# Patient Record
Sex: Female | Born: 1935 | Race: White | Hispanic: No | Marital: Married | Smoking: Never smoker
Health system: Southern US, Community
[De-identification: ages and names within clinical notes are randomized; demographics above are authoritative.]

## PROBLEM LIST (undated history)

## (undated) DIAGNOSIS — Z86018 Personal history of other benign neoplasm: Secondary | ICD-10-CM

## (undated) DIAGNOSIS — C439 Malignant melanoma of skin, unspecified: Secondary | ICD-10-CM

## (undated) DIAGNOSIS — I10 Essential (primary) hypertension: Secondary | ICD-10-CM

## (undated) DIAGNOSIS — E039 Hypothyroidism, unspecified: Secondary | ICD-10-CM

## (undated) DIAGNOSIS — E079 Disorder of thyroid, unspecified: Secondary | ICD-10-CM

## (undated) DIAGNOSIS — M1990 Unspecified osteoarthritis, unspecified site: Secondary | ICD-10-CM

## (undated) DIAGNOSIS — C801 Malignant (primary) neoplasm, unspecified: Secondary | ICD-10-CM

## (undated) DIAGNOSIS — I499 Cardiac arrhythmia, unspecified: Secondary | ICD-10-CM

## (undated) DIAGNOSIS — R490 Dysphonia: Secondary | ICD-10-CM

## (undated) DIAGNOSIS — K589 Irritable bowel syndrome without diarrhea: Secondary | ICD-10-CM

## (undated) DIAGNOSIS — L570 Actinic keratosis: Secondary | ICD-10-CM

## (undated) HISTORY — DX: Malignant melanoma of skin, unspecified: C43.9

## (undated) HISTORY — DX: Irritable bowel syndrome, unspecified: K58.9

## (undated) HISTORY — DX: Disorder of thyroid, unspecified: E07.9

## (undated) HISTORY — PX: BREAST EXCISIONAL BIOPSY: SUR124

## (undated) HISTORY — PX: CHOLECYSTECTOMY: SHX55

## (undated) HISTORY — PX: HEMORRHOID SURGERY: SHX153

## (undated) HISTORY — DX: Unspecified osteoarthritis, unspecified site: M19.90

## (undated) HISTORY — DX: Essential (primary) hypertension: I10

## (undated) HISTORY — PX: ABDOMINAL HYSTERECTOMY: SHX81

## (undated) HISTORY — DX: Actinic keratosis: L57.0

---
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
## 2004-11-28 ENCOUNTER — Ambulatory Visit: Payer: Self-pay | Admitting: Family Medicine

## 2005-11-04 ENCOUNTER — Ambulatory Visit: Payer: Self-pay | Admitting: Family Medicine

## 2005-12-05 ENCOUNTER — Ambulatory Visit: Payer: Self-pay | Admitting: Family Medicine

## 2005-12-17 ENCOUNTER — Ambulatory Visit: Payer: Self-pay | Admitting: Gastroenterology

## 2006-12-09 ENCOUNTER — Ambulatory Visit: Payer: Self-pay | Admitting: Family Medicine

## 2007-12-10 ENCOUNTER — Ambulatory Visit: Payer: Self-pay | Admitting: Family Medicine

## 2008-12-12 ENCOUNTER — Ambulatory Visit: Payer: Self-pay | Admitting: Family Medicine

## 2009-10-26 ENCOUNTER — Ambulatory Visit (HOSPITAL_COMMUNITY): Admission: RE | Admit: 2009-10-26 | Discharge: 2009-10-26 | Payer: Self-pay | Admitting: Ophthalmology

## 2009-11-23 ENCOUNTER — Ambulatory Visit (HOSPITAL_COMMUNITY): Admission: RE | Admit: 2009-11-23 | Discharge: 2009-11-23 | Payer: Self-pay | Admitting: Ophthalmology

## 2009-12-14 ENCOUNTER — Ambulatory Visit: Payer: Self-pay | Admitting: Family Medicine

## 2010-06-09 LAB — BASIC METABOLIC PANEL
CO2: 30 mEq/L (ref 19–32)
Calcium: 9.4 mg/dL (ref 8.4–10.5)
Chloride: 103 mEq/L (ref 96–112)
GFR calc Af Amer: >60 mL/min (ref >60)
Sodium: 138 mEq/L (ref 135–145)

## 2010-06-09 LAB — HEMOGLOBIN AND HEMATOCRIT, BLOOD
HCT: 43 % (ref 36.0–46.0)
Hemoglobin: 14.4 g/dL (ref 12.0–15.0)

## 2010-12-31 ENCOUNTER — Ambulatory Visit: Payer: Self-pay | Admitting: Family Medicine

## 2011-01-07 ENCOUNTER — Ambulatory Visit: Payer: Self-pay | Admitting: Urology

## 2011-01-30 ENCOUNTER — Ambulatory Visit: Payer: Self-pay | Admitting: Urology

## 2011-02-04 ENCOUNTER — Ambulatory Visit: Payer: Self-pay | Admitting: Urology

## 2011-02-06 LAB — PATHOLOGY REPORT

## 2012-01-01 ENCOUNTER — Ambulatory Visit: Payer: Self-pay | Admitting: Family Medicine

## 2013-01-01 ENCOUNTER — Ambulatory Visit: Payer: Self-pay | Admitting: Family Medicine

## 2014-01-03 ENCOUNTER — Ambulatory Visit: Payer: Self-pay | Admitting: Family Medicine

## 2014-10-04 ENCOUNTER — Other Ambulatory Visit: Payer: Self-pay | Admitting: Family Medicine

## 2014-10-04 DIAGNOSIS — Z1231 Encounter for screening mammogram for malignant neoplasm of breast: Secondary | ICD-10-CM

## 2015-01-05 ENCOUNTER — Other Ambulatory Visit: Payer: Self-pay | Admitting: Family Medicine

## 2015-01-05 ENCOUNTER — Ambulatory Visit
Admission: RE | Admit: 2015-01-05 | Discharge: 2015-01-05 | Disposition: A | Discharge status: Home / Self Care | Payer: Medicare PPO | Source: Ambulatory Visit | Attending: Family Medicine | Admitting: Family Medicine

## 2015-01-05 DIAGNOSIS — Z1231 Encounter for screening mammogram for malignant neoplasm of breast: Secondary | ICD-10-CM | POA: Diagnosis present

## 2015-10-12 ENCOUNTER — Other Ambulatory Visit: Payer: Self-pay | Admitting: Family Medicine

## 2015-10-12 DIAGNOSIS — Z1239 Encounter for other screening for malignant neoplasm of breast: Secondary | ICD-10-CM

## 2016-01-08 ENCOUNTER — Ambulatory Visit
Admission: RE | Admit: 2016-01-08 | Discharge: 2016-01-08 | Disposition: A | Discharge status: Home / Self Care | Payer: Medicare Other | Source: Ambulatory Visit | Attending: Family Medicine | Admitting: Family Medicine

## 2016-01-08 ENCOUNTER — Other Ambulatory Visit: Payer: Self-pay | Admitting: Family Medicine

## 2016-01-08 DIAGNOSIS — Z1231 Encounter for screening mammogram for malignant neoplasm of breast: Secondary | ICD-10-CM | POA: Diagnosis not present

## 2016-01-08 DIAGNOSIS — Z1239 Encounter for other screening for malignant neoplasm of breast: Secondary | ICD-10-CM

## 2016-10-18 ENCOUNTER — Other Ambulatory Visit: Payer: Self-pay | Admitting: Family Medicine

## 2016-10-18 DIAGNOSIS — Z1231 Encounter for screening mammogram for malignant neoplasm of breast: Secondary | ICD-10-CM

## 2017-01-09 ENCOUNTER — Ambulatory Visit
Admission: RE | Admit: 2017-01-09 | Discharge: 2017-01-09 | Disposition: A | Discharge status: Home / Self Care | Payer: Medicare Other | Source: Ambulatory Visit | Attending: Family Medicine | Admitting: Family Medicine

## 2017-01-09 DIAGNOSIS — Z1231 Encounter for screening mammogram for malignant neoplasm of breast: Secondary | ICD-10-CM | POA: Diagnosis not present

## 2017-01-09 HISTORY — DX: Malignant (primary) neoplasm, unspecified: C80.1

## 2017-07-28 ENCOUNTER — Other Ambulatory Visit: Payer: Self-pay

## 2017-07-28 ENCOUNTER — Emergency Department
Admission: EM | Admit: 2017-07-28 | Discharge: 2017-07-28 | Disposition: A | Discharge status: Home / Self Care | Payer: Medicare Other | Attending: Emergency Medicine | Admitting: Emergency Medicine

## 2017-07-28 ENCOUNTER — Emergency Department: Payer: Medicare Other

## 2017-07-28 ENCOUNTER — Encounter: Payer: Self-pay | Admitting: Emergency Medicine

## 2017-07-28 DIAGNOSIS — R42 Dizziness and giddiness: Secondary | ICD-10-CM | POA: Insufficient documentation

## 2017-07-28 DIAGNOSIS — Z79899 Other long term (current) drug therapy: Secondary | ICD-10-CM | POA: Insufficient documentation

## 2017-07-28 DIAGNOSIS — M542 Cervicalgia: Secondary | ICD-10-CM | POA: Insufficient documentation

## 2017-07-28 DIAGNOSIS — Z7982 Long term (current) use of aspirin: Secondary | ICD-10-CM | POA: Diagnosis not present

## 2017-07-28 LAB — TROPONIN I: Troponin I: <0.03 ng/mL (ref <0.03)

## 2017-07-28 LAB — CBC
HEMATOCRIT: 43.5 % (ref 35.0–47.0)
HEMOGLOBIN: 14.9 g/dL (ref 12.0–16.0)
MCH: 31.6 pg (ref 26.0–34.0)
MCHC: 34.2 g/dL (ref 32.0–36.0)
MCV: 92.2 fL (ref 80.0–100.0)
Platelets: 222 10*3/uL (ref 150–440)
RBC: 4.71 MIL/uL (ref 3.80–5.20)
RDW: 12.6 % (ref 11.5–14.5)
WBC: 6.4 10*3/uL (ref 3.6–11.0)

## 2017-07-28 LAB — BASIC METABOLIC PANEL
ANION GAP: 6 (ref 5–15)
BUN: 17 mg/dL (ref 6–20)
CHLORIDE: 105 mmol/L (ref 101–111)
CO2: 29 mmol/L (ref 22–32)
Calcium: 9.3 mg/dL (ref 8.9–10.3)
Creatinine, Ser: 0.84 mg/dL (ref 0.44–1.00)
GFR calc Af Amer: >60 mL/min (ref >60)
GFR calc non Af Amer: 59 mL/min — ABNORMAL LOW (ref >60)
GLUCOSE: 164 mg/dL — AB (ref 65–99)
POTASSIUM: 3.8 mmol/L (ref 3.5–5.1)
Sodium: 140 mmol/L (ref 135–145)

## 2017-07-28 LAB — URINALYSIS, COMPLETE (UACMP) WITH MICROSCOPIC
BACTERIA UA: NONE SEEN
Bilirubin Urine: NEGATIVE
Glucose, UA: NEGATIVE mg/dL
Hgb urine dipstick: NEGATIVE
KETONES UR: NEGATIVE mg/dL
Leukocytes, UA: NEGATIVE
Nitrite: NEGATIVE
PROTEIN: NEGATIVE mg/dL
Specific Gravity, Urine: 1.004 — ABNORMAL LOW (ref 1.005–1.030)
Squamous Epithelial / LPF: NONE SEEN (ref 0–5)
pH: 6 (ref 5.0–8.0)

## 2017-07-28 MED ORDER — SODIUM CHLORIDE 0.9 % IV BOLUS
1000.0000 mL | Freq: Once | INTRAVENOUS | Status: AC
  Administered 2017-07-28: 1000 mL via INTRAVENOUS

## 2017-07-28 MED ORDER — IOPAMIDOL (ISOVUE-370) INJECTION 76%
75.0000 mL | Freq: Once | INTRAVENOUS | Status: AC | PRN
Start: 2017-07-28 — End: 2017-07-28
  Administered 2017-07-28: 75 mL via INTRAVENOUS

## 2017-07-28 NOTE — ED Triage Notes (Signed)
Pt presents to ED with c/o dizziness. Pt states had surgery to R side "years ago" and can't turn her head to the R. Pt states HA to the R side of her head. Pt states a knot popped up to the R side of her neck and since that patient has been more light headed.

## 2017-07-28 NOTE — ED Notes (Signed)
Pt taken to CT.

## 2017-07-28 NOTE — ED Provider Notes (Signed)
[facility] Emergency Department Provider Note  ____________________________________________   First MD Initiated Contact with Patient 07/28/17 1620     (approximate)  I have reviewed the triage vital signs and the nursing notes.   HISTORY  Chief Complaint Dizziness   HPI Amanda Mann is a 84 y.o. female who is presenting to the emergency department with right-sided neck pain that is worsening over the past several weeks.  She says that she had a mass resected from the right side of her neck in the 70s.  She is saying that ever since this mass was resected she has been unable to turn her head to the right because of pain and also passing out.  However, over the past several weeks she has felt a recurrence of a mass to the right side of the base of her neck as well as feeling lightheaded when she turns her head to the right.   Past Medical History:  Diagnosis Date  . Cancer ([place])    melanoma    There are no active problems to display for this patient.   Past Surgical History:  Procedure Laterality Date  . ABDOMINAL HYSTERECTOMY    . BREAST BIOPSY Bilateral    neg  . CHOLECYSTECTOMY    . HEMORRHOID SURGERY      Prior to Admission medications   Medication Sig Start Date End Date Taking? Authorizing Provider  aspirin EC 81 MG tablet Take 1 tablet by mouth daily.   Yes [provider]  atenolol (TENORMIN) 25 MG tablet Take 1 tablet by mouth daily. 06/03/17  Yes [provider]  Cholecalciferol (VITAMIN D3) 2000 units capsule Take 1 capsule by mouth daily.   Yes [provider]  levothyroxine (SYNTHROID, LEVOTHROID) 75 MCG tablet Take 1 tablet by mouth daily. 06/02/17  Yes [provider]  Multiple Vitamin (MULTI-VITAMINS) TABS Take 1 tablet by mouth daily.   Yes [provider]    Allergies Amoxicillin; Ibandronic acid; Moxifloxacin; and Sulfa antibiotics  Family History  Problem Relation Age of Onset   . Breast cancer Sister 40    Social History Social History   Tobacco Use  . Smoking status: Never Smoker  . Smokeless tobacco: Never Used  Substance Use Topics  . Alcohol use: Not Currently  . Drug use: Never    Review of Systems  Constitutional: No fever/chills Eyes: No visual changes. ENT: No sore throat. Cardiovascular: Denies chest pain. Respiratory: Denies shortness of breath. Gastrointestinal: No abdominal pain.  No nausea, no vomiting.  No diarrhea.  No constipation. Genitourinary: Negative for dysuria. Musculoskeletal: Negative for back pain. Skin: Negative for rash. Neurological: Negative for headaches, focal weakness or numbness.   ____________________________________________   PHYSICAL EXAM:  VITAL SIGNS: ED Triage Vitals [07/28/17 1520]  Enc Vitals Group     BP (!) 211/52     Pulse Rate 78     Resp 18     Temp 97.7 F (36.5 C)     Temp Source Oral     SpO2 100 %     Weight 127 lb (57.6 kg)     Height 5\' 1"  (1.549 m)     Head Circumference      Peak Flow      Pain Score      Pain Loc      Pain Edu?      Excl. in [place]?     Constitutional: Alert and oriented. Well appearing and in no acute distress. Eyes:  Conjunctivae are normal.  Head: Atraumatic. Nose: No congestion/rhinnorhea. Mouth/Throat: Mucous membranes are moist.  Neck: No stridor.  Possible scar tissue between the clavicle and the trapezius muscle.  However I do palpate any discernible large mass.  There is no erythema, induration, tenderness. Cardiovascular: Normal rate, regular rhythm. Grossly normal heart sounds.   Respiratory: Normal respiratory effort.  No retractions. Lungs CTAB. Gastrointestinal: Soft and nontender. No distention.  Musculoskeletal: No lower extremity tenderness nor edema.  No joint effusions. Neurologic:  Normal speech and language. No gross focal neurologic deficits are appreciated. Skin:  Skin is warm, dry and intact. No rash noted. Psychiatric: Mood and  affect are normal. Speech and behavior are normal.  ____________________________________________   LABS (all labs ordered are listed, but only abnormal results are displayed)  Labs Reviewed  BASIC METABOLIC PANEL - Abnormal; Notable for the following components:      Result Value   Glucose, Bld 164 (*)    GFR calc non Af Amer 59 (*)    All other components within normal limits  URINALYSIS, COMPLETE (UACMP) WITH MICROSCOPIC - Abnormal; Notable for the following components:   Color, Urine STRAW (*)    APPearance CLEAR (*)    Specific Gravity, Urine 1.004 (*)    All other components within normal limits  CBC  TROPONIN I  CBG MONITORING, ED   ____________________________________________  EKG  ED ECG REPORT I, Doran Stabler, the attending physician, personally viewed and interpreted this ECG.   Date: 07/28/2017  EKG Time: 1554  Rate: 71  Rhythm: normal sinus rhythm  Axis: Normal  Intervals:none  ST&T Change: No ST segment elevation or depression.  No abnormal T wave inversion.  ____________________________________________  RADIOLOGY  CT of the brain without any acute process.  CT angiography without acute vascular abnormality.  No acute soft tissue abnormalities.  7 mm hyperdense nodule noted within the right lobe of the thyroid.  Doubtful significance. ____________________________________________   PROCEDURES  Procedure(s) performed:   Procedures  Critical Care performed:   ____________________________________________   INITIAL IMPRESSION / ASSESSMENT AND PLAN / ED COURSE  Pertinent labs & imaging results that were available during my care of the patient were reviewed by me and considered in my medical decision making (see chart for details).  DDX: Lightheadedness, dehydration, likely abnormality, CVA, ACS, UTI, circulatory compromise, mass recurrence As part of my medical decision making, I reviewed the following data within the electronic medical  record:  Notes from prior primary care visits.  ----------------------------------------- 6:26 PM on 07/28/2017 -----------------------------------------  Patient at this time without complaint.  Denies lightheadedness after fluids.  Reassuring CT angiography without any vascular compromise nor is there any recurrence of any visible mass.  Patient will follow-up with her primary care doctor.  She is understanding of the plan and willing to comply. ____________________________________________   FINAL CLINICAL IMPRESSION(S) / ED DIAGNOSES  Lightheadedness.    NEW MEDICATIONS STARTED DURING THIS VISIT:  New Prescriptions   No medications on file     Note:  This document was prepared using Dragon voice recognition software and may include unintentional dictation errors.     Orbie Pyo, MD 07/28/17 [phone]

## 2017-07-28 NOTE — ED Notes (Signed)
Pt ambulatory to toilet with steady gait noted.  

## 2017-07-28 NOTE — ED Notes (Signed)
Dr. Schaevitz at bedside.  

## 2017-12-03 ENCOUNTER — Other Ambulatory Visit: Payer: Self-pay | Admitting: Family Medicine

## 2017-12-03 DIAGNOSIS — Z1231 Encounter for screening mammogram for malignant neoplasm of breast: Secondary | ICD-10-CM

## 2018-01-12 ENCOUNTER — Ambulatory Visit
Admission: RE | Admit: 2018-01-12 | Discharge: 2018-01-12 | Disposition: A | Discharge status: Home / Self Care | Payer: Medicare Other | Source: Ambulatory Visit | Attending: Family Medicine | Admitting: Family Medicine

## 2018-01-12 DIAGNOSIS — Z1231 Encounter for screening mammogram for malignant neoplasm of breast: Secondary | ICD-10-CM | POA: Diagnosis present

## 2018-01-21 DIAGNOSIS — Z86018 Personal history of other benign neoplasm: Secondary | ICD-10-CM

## 2018-01-21 HISTORY — DX: Personal history of other benign neoplasm: Z86.018

## 2018-01-22 ENCOUNTER — Other Ambulatory Visit: Payer: Self-pay | Admitting: Family Medicine

## 2018-01-22 DIAGNOSIS — N6320 Unspecified lump in the left breast, unspecified quadrant: Secondary | ICD-10-CM

## 2018-01-22 DIAGNOSIS — R928 Other abnormal and inconclusive findings on diagnostic imaging of breast: Secondary | ICD-10-CM

## 2018-01-29 ENCOUNTER — Ambulatory Visit
Admission: RE | Admit: 2018-01-29 | Discharge: 2018-01-29 | Disposition: A | Discharge status: Home / Self Care | Payer: Medicare Other | Source: Ambulatory Visit | Attending: Family Medicine | Admitting: Family Medicine

## 2018-01-29 DIAGNOSIS — R928 Other abnormal and inconclusive findings on diagnostic imaging of breast: Secondary | ICD-10-CM | POA: Insufficient documentation

## 2018-01-29 DIAGNOSIS — N6320 Unspecified lump in the left breast, unspecified quadrant: Secondary | ICD-10-CM | POA: Diagnosis present

## 2018-02-02 ENCOUNTER — Other Ambulatory Visit: Payer: Self-pay | Admitting: Family Medicine

## 2018-02-02 DIAGNOSIS — R928 Other abnormal and inconclusive findings on diagnostic imaging of breast: Secondary | ICD-10-CM

## 2018-02-05 ENCOUNTER — Ambulatory Visit
Admission: RE | Admit: 2018-02-05 | Discharge: 2018-02-05 | Disposition: A | Discharge status: Home / Self Care | Payer: Medicare Other | Source: Ambulatory Visit | Attending: Family Medicine | Admitting: Family Medicine

## 2018-02-05 DIAGNOSIS — R928 Other abnormal and inconclusive findings on diagnostic imaging of breast: Secondary | ICD-10-CM | POA: Diagnosis present

## 2018-02-05 HISTORY — PX: BREAST BIOPSY: SHX20

## 2018-02-12 ENCOUNTER — Other Ambulatory Visit: Payer: Self-pay

## 2018-02-12 DIAGNOSIS — C50919 Malignant neoplasm of unspecified site of unspecified female breast: Secondary | ICD-10-CM

## 2018-02-12 NOTE — Progress Notes (Signed)
  Oncology Nurse Navigator Documentation  Navigator Location: [facility]Med Onc (02/12/18 1600) Referral date to RadOnc/MedOnc: 02/17/18 (02/12/18 1600) )Navigator Encounter Type: Introductory phone call (02/12/18 1600)   Abnormal Finding Date: 01/29/18 (02/12/18 1600) Confirmed Diagnosis Date: 02/05/18 (02/12/18 1600)               Patient Visit Type: Initial (02/12/18 1600) Treatment Phase: Pre-Tx/Tx Discussion (02/12/18 1600) Barriers/Navigation Needs: Coordination of Care;Education (02/12/18 1600) Education: Accessing Care/ Finding Providers;Coping with Diagnosis/ Prognosis (02/12/18 1600) Interventions: Coordination of Care;Referrals (02/12/18 1600)   Coordination of Care: Appts (02/12/18 1600)                  Time Spent with Patient: 60 (02/12/18 1600)   Introduced to IT trainer.  Will take  Breast Cancer Treatment Handbook/folder with hospital services  To Dr. Deniece Ree office for patient at her initial surgical consult 02/13/18 at 12:00.  She is scheduled to see Dr. Tasia Catchings on 02/17/18 at 10:45.  Plan to accompany patient to this initial Med/Onc appointment.  Patient reports she discussed pathology results with her primary physician, and she would like to hear recommendations for treatment.

## 2018-02-13 ENCOUNTER — Other Ambulatory Visit: Payer: Self-pay | Admitting: Specialist

## 2018-02-13 ENCOUNTER — Ambulatory Visit: Payer: Self-pay | Admitting: General Surgery

## 2018-02-13 LAB — SURGICAL PATHOLOGY

## 2018-02-13 NOTE — H&P (Signed)
PATIENT PROFILE: Amanda Mann is a 84 y.o. female who presents to the Clinic for consultation at the request of Dr. Lovie Mann for evaluation of left breast cancer.  PCP:  Amanda Nancy, MD  HISTORY OF PRESENT ILLNESS: Amanda Mann reports having her usual screening mammogram. Patient denies any breast pain, palpable mass, skin changes or nipple changes. She had screening mammogram on 01/12/18. It was found with a suspicious left breast mass that lead to diagnostic mammogram and biopsy. Biopsy showed invasive mammary carcinoma of 4 mm on the left breast at 10:30. Mass is ER/PR positive. Clip marker left in place.   Patient has personal history of melanoma.  Family history of breast cancer: sister  Family history of other cancers: none             Menarche: 81 years Menopause: 84 year old (hysterectomy due to bleeding) Used OCP: denies Used estrogen and progesterone therapy: estrogen therapy for 4-7 years  History of Radiation to the chest: none Previous history of bilateral breast biopsy.   PROBLEM LIST:         Problem List  Date Reviewed: 10/20/2017         Noted   Malignant neoplasm of left female breast (CMS-HCC) 02/11/2018   Tachycardia 04/21/2017   Essential hypertension 03/14/2014   Acquired hypothyroidism, unspecified 03/14/2014   Osteoporosis Unknown      GENERAL REVIEW OF SYSTEMS:   General ROS: negative for - chills, fatigue, fever, weight gain or weight loss Allergy and Immunology ROS: negative for - hives  Hematological and Lymphatic ROS: negative for - bleeding problems or bruising, negative for palpable nodes Endocrine ROS: negative for - heat or cold intolerance, hair changes Respiratory ROS: negative for - cough, shortness of breath or wheezing Cardiovascular ROS: no chest pain or palpitations GI ROS: negative for nausea, vomiting, abdominal pain, diarrhea, constipation Musculoskeletal ROS: negative for - joint swelling or muscle  pain Neurological ROS: negative for - confusion, syncope Dermatological ROS: negative for pruritus and rash Psychiatric: negative for anxiety, depression, difficulty sleeping and memory loss  MEDICATIONS: CurrentMedications        Current Outpatient Medications  Medication Sig Dispense Refill  . atenolol (TENORMIN) 25 MG tablet ONE-HALF TABLET BY MOUTH TWICE DAILY 90 tablet 3  . cholecalciferol (VITAMIN D3) 2,000 unit capsule Take 2,000 Units by mouth once daily.    Marland Kitchen levothyroxine (SYNTHROID, LEVOTHROID) 75 MCG tablet TAKE 1 TABLET EVERY DAY ON EMPTY STOMACHWITH A GLASS OF WATER AT LEAST 30-60 MINBEFORE BREAKFAST 90 tablet 1  . multivitamin tablet Take 1 tablet by mouth once daily.    Marland Kitchen aspirin 81 MG EC tablet Take 81 mg by mouth once daily.     No current facility-administered medications for this visit.       ALLERGIES: Amoxicillin; Boniva [ibandronate]; Ibandronic acid; Moxifloxacin; and Sulfa (sulfonamide antibiotics)  PAST MEDICAL HISTORY:     Past Medical History:  Diagnosis Date  . Allergic rhinitis   . Breast cancer (CMS-HCC) 02/11/2018  . Cataract   . Essential hypertension, benign   . Menopausal syndrome   . Osteoporosis   . PUD (peptic ulcer disease)   . Tachycardia   . Unspecified hypothyroidism   . Unspecified vitamin D deficiency     PAST SURGICAL HISTORY: History reviewed. No pertinent surgical history.   FAMILY HISTORY: History reviewed. No pertinent family history.   SOCIAL HISTORY: Social History          Socioeconomic History  . Marital status:  Married    Spouse name: Not on file  . Number of children: Not on file  . Years of education: Not on file  . Highest education level: Not on file  Occupational History  . Not on file  Social Needs  . Financial resource strain: Not on file  . Food insecurity:    Worry: Not on file    Inability: Not on file  . Transportation needs:    Medical: Not on file     Non-medical: Not on file  Tobacco Use  . Smoking status: Never Smoker  . Smokeless tobacco: Never Used  Substance and Sexual Activity  . Alcohol use: No  . Drug use: No  . Sexual activity: Never  Other Topics Concern  . Not on file  Social History Narrative   Exercise:  4-5 times a week for 20 minutes.    Diet:  Red meat none, fast foods and fried foods once a week.   Colon:11/2012   Mammo: 2014   Pap: 2009      PHYSICAL EXAM: Vitals:   02/13/18 1157  BP: 152/74  Pulse: 78  Temp: 36.1 C (96.9 F)   Body mass index is 24.76 kg/m. Weight: 57.5 kg (126 lb 12.8 oz)   GENERAL: Alert, active, oriented x3  HEENT: Pupils equal reactive to light. Extraocular movements are intact. Sclera clear. Palpebral conjunctiva normal red color.Pharynx clear.  NECK: Supple with no palpable mass and no adenopathy.  LUNGS: Sound clear with no rales rhonchi or wheezes.  HEART: Regular rhythm S1 and S2 without murmur.  BREAST: Right breast normal without mass, skin or nipple changes or axillary nodes. Left breast with large ecchymosis, and hematoma.   ABDOMEN: Soft and depressible, nontender with no palpable mass, no hepatomegaly.  EXTREMITIES: Well-developed well-nourished symmetrical with no dependent edema.  NEUROLOGICAL: Awake alert oriented, facial expression symmetrical, moving all extremities.  REVIEW OF DATA: I have reviewed the following data today:      Initial consult on 02/13/2018  Component Date Value  . WBC (White Blood Cell Co* 02/13/2018 7.3   . RBC (Red Blood Cell Coun* 02/13/2018 4.45   . Hemoglobin 02/13/2018 13.9   . Hematocrit 02/13/2018 41.8   . MCV (Mean Corpuscular Vo* 02/13/2018 93.9   . MCH (Mean Corpuscular He* 02/13/2018 31.2   . MCHC (Mean Corpuscular H* 02/13/2018 33.3   . Platelet Count 02/13/2018 233   . RDW-CV (Red Cell Distrib* 02/13/2018 11.9   . MPV (Mean Platelet Volum* 02/13/2018 10.6   . Neutrophils 02/13/2018 4.38   .  Lymphocytes 02/13/2018 1.72   . Monocytes 02/13/2018 0.78   . Eosinophils 02/13/2018 0.30   . Basophils 02/13/2018 0.07   . Neutrophil % 02/13/2018 60.4   . Lymphocyte % 02/13/2018 23.7   . Monocyte % 02/13/2018 10.7   . Eosinophil % 02/13/2018 4.1   . Basophil% 02/13/2018 1.0   . Immature Granulocyte % 02/13/2018 0.1   . Immature Granulocyte Cou* 02/13/2018 0.01      ASSESSMENT: Ms. Curb is a 84 y.o. female presenting for consultation for left breast cancer.    Patient was oriented again about the pathology results. Surgical alternatives were discussed with patient including partial vs total mastectomy. Surgical technique and post operative care was discussed with patient. Risk of surgery was discussed with patient including but not limited to: wound infection, seroma, hematoma, brachial plexopathy, mondor's disease (thrombosis of small veins of breast), chronic wound pain, breast lymphedema, altered sensation to the nipple and  cosmesis among others.   After a long discussion with patient and her husband, patient refers wants to proceed with surgical management. I oriented the patient about the risk of anesthesia and her advance age. She understand. I also recommended the patient to discuss with Oncologist about the other alternative of treatment in adjunct with surgical management.   PLAN: 1. Left needle guided partial mastectomy 2. Left axillary sentinel lymph node biopsy 3. CBC, CMP 4. Internal Medicine clearance (Dr. Lovie Mann) 5. Avoid aspirin 5 days before surgery 6. See Oncologist on Tuesday 26th of November.  7. Contact us if has any question or concern.   Patient and her husband verbalized understanding, all questions were answered, and were agreeable with the plan outlined above.   Herbert Pun, MD  Electronically signed by Herbert Pun, MD

## 2018-02-13 NOTE — H&P (View-Only) (Signed)
PATIENT PROFILE: Amanda Mann is a 84 y.o. female who presents to the Clinic for consultation at the request of Dr. Lovie Mann for evaluation of left breast cancer.  PCP:  Amanda Nancy, MD  HISTORY OF PRESENT ILLNESS: Amanda Mann reports having her usual screening mammogram. Patient denies any breast pain, palpable mass, skin changes or nipple changes. She had screening mammogram on 01/12/18. It was found with a suspicious left breast mass that lead to diagnostic mammogram and biopsy. Biopsy showed invasive mammary carcinoma of 4 mm on the left breast at 10:30. Mass is ER/PR positive. Clip marker left in place.   Patient has personal history of melanoma.  Family history of breast cancer: sister  Family history of other cancers: none             Menarche: 55 years Menopause: 84 year old (hysterectomy due to bleeding) Used OCP: denies Used estrogen and progesterone therapy: estrogen therapy for 4-7 years  History of Radiation to the chest: none Previous history of bilateral breast biopsy.   PROBLEM LIST:         Problem List  Date Reviewed: 10/20/2017         Noted   Malignant neoplasm of left female breast (CMS-HCC) 02/11/2018   Tachycardia 04/21/2017   Essential hypertension 03/14/2014   Acquired hypothyroidism, unspecified 03/14/2014   Osteoporosis Unknown      GENERAL REVIEW OF SYSTEMS:   General ROS: negative for - chills, fatigue, fever, weight gain or weight loss Allergy and Immunology ROS: negative for - hives  Hematological and Lymphatic ROS: negative for - bleeding problems or bruising, negative for palpable nodes Endocrine ROS: negative for - heat or cold intolerance, hair changes Respiratory ROS: negative for - cough, shortness of breath or wheezing Cardiovascular ROS: no chest pain or palpitations GI ROS: negative for nausea, vomiting, abdominal pain, diarrhea, constipation Musculoskeletal ROS: negative for - joint swelling or muscle  pain Neurological ROS: negative for - confusion, syncope Dermatological ROS: negative for pruritus and rash Psychiatric: negative for anxiety, depression, difficulty sleeping and memory loss  MEDICATIONS: CurrentMedications        Current Outpatient Medications  Medication Sig Dispense Refill  . atenolol (TENORMIN) 25 MG tablet ONE-HALF TABLET BY MOUTH TWICE DAILY 90 tablet 3  . cholecalciferol (VITAMIN D3) 2,000 unit capsule Take 2,000 Units by mouth once daily.    Marland Kitchen levothyroxine (SYNTHROID, LEVOTHROID) 75 MCG tablet TAKE 1 TABLET EVERY DAY ON EMPTY STOMACHWITH A GLASS OF WATER AT LEAST 30-60 MINBEFORE BREAKFAST 90 tablet 1  . multivitamin tablet Take 1 tablet by mouth once daily.    Marland Kitchen aspirin 81 MG EC tablet Take 81 mg by mouth once daily.     No current facility-administered medications for this visit.       ALLERGIES: Amoxicillin; Boniva [ibandronate]; Ibandronic acid; Moxifloxacin; and Sulfa (sulfonamide antibiotics)  PAST MEDICAL HISTORY:     Past Medical History:  Diagnosis Date  . Allergic rhinitis   . Breast cancer (CMS-HCC) 02/11/2018  . Cataract   . Essential hypertension, benign   . Menopausal syndrome   . Osteoporosis   . PUD (peptic ulcer disease)   . Tachycardia   . Unspecified hypothyroidism   . Unspecified vitamin D deficiency     PAST SURGICAL HISTORY: History reviewed. No pertinent surgical history.   FAMILY HISTORY: History reviewed. No pertinent family history.   SOCIAL HISTORY: Social History          Socioeconomic History  . Marital status:  Married    Spouse name: Not on file  . Number of children: Not on file  . Years of education: Not on file  . Highest education level: Not on file  Occupational History  . Not on file  Social Needs  . Financial resource strain: Not on file  . Food insecurity:    Worry: Not on file    Inability: Not on file  . Transportation needs:    Medical: Not on file     Non-medical: Not on file  Tobacco Use  . Smoking status: Never Smoker  . Smokeless tobacco: Never Used  Substance and Sexual Activity  . Alcohol use: No  . Drug use: No  . Sexual activity: Never  Other Topics Concern  . Not on file  Social History Narrative   Exercise:  4-5 times a week for 20 minutes.    Diet:  Red meat none, fast foods and fried foods once a week.   Colon:11/2012   Mammo: 2014   Pap: 2009      PHYSICAL EXAM: Vitals:   02/13/18 1157  BP: 152/74  Pulse: 78  Temp: 36.1 C (96.9 F)   Body mass index is 24.76 kg/m. Weight: 57.5 kg (126 lb 12.8 oz)   GENERAL: Alert, active, oriented x3  HEENT: Pupils equal reactive to light. Extraocular movements are intact. Sclera clear. Palpebral conjunctiva normal red color.Pharynx clear.  NECK: Supple with no palpable mass and no adenopathy.  LUNGS: Sound clear with no rales rhonchi or wheezes.  HEART: Regular rhythm S1 and S2 without murmur.  BREAST: Right breast normal without mass, skin or nipple changes or axillary nodes. Left breast with large ecchymosis, and hematoma.   ABDOMEN: Soft and depressible, nontender with no palpable mass, no hepatomegaly.  EXTREMITIES: Well-developed well-nourished symmetrical with no dependent edema.  NEUROLOGICAL: Awake alert oriented, facial expression symmetrical, moving all extremities.  REVIEW OF DATA: I have reviewed the following data today:      Initial consult on 02/13/2018  Component Date Value  . WBC (White Blood Cell Co* 02/13/2018 7.3   . RBC (Red Blood Cell Coun* 02/13/2018 4.45   . Hemoglobin 02/13/2018 13.9   . Hematocrit 02/13/2018 41.8   . MCV (Mean Corpuscular Vo* 02/13/2018 93.9   . MCH (Mean Corpuscular He* 02/13/2018 31.2   . MCHC (Mean Corpuscular H* 02/13/2018 33.3   . Platelet Count 02/13/2018 233   . RDW-CV (Red Cell Distrib* 02/13/2018 11.9   . MPV (Mean Platelet Volum* 02/13/2018 10.6   . Neutrophils 02/13/2018 4.38   .  Lymphocytes 02/13/2018 1.72   . Monocytes 02/13/2018 0.78   . Eosinophils 02/13/2018 0.30   . Basophils 02/13/2018 0.07   . Neutrophil % 02/13/2018 60.4   . Lymphocyte % 02/13/2018 23.7   . Monocyte % 02/13/2018 10.7   . Eosinophil % 02/13/2018 4.1   . Basophil% 02/13/2018 1.0   . Immature Granulocyte % 02/13/2018 0.1   . Immature Granulocyte Cou* 02/13/2018 0.01      ASSESSMENT: Ms. Beedle is a 84 y.o. female presenting for consultation for left breast cancer.    Patient was oriented again about the pathology results. Surgical alternatives were discussed with patient including partial vs total mastectomy. Surgical technique and post operative care was discussed with patient. Risk of surgery was discussed with patient including but not limited to: wound infection, seroma, hematoma, brachial plexopathy, mondor's disease (thrombosis of small veins of breast), chronic wound pain, breast lymphedema, altered sensation to the nipple and  cosmesis among others.   After a long discussion with patient and her husband, patient refers wants to proceed with surgical management. I oriented the patient about the risk of anesthesia and her advance age. She understand. I also recommended the patient to discuss with Oncologist about the other alternative of treatment in adjunct with surgical management.   PLAN: 1. Left needle guided partial mastectomy 2. Left axillary sentinel lymph node biopsy 3. CBC, CMP 4. Internal Medicine clearance (Dr. Lovie Mann) 5. Avoid aspirin 5 days before surgery 6. See Oncologist on Tuesday 26th of November.  7. Contact us if has any question or concern.   Patient and her husband verbalized understanding, all questions were answered, and were agreeable with the plan outlined above.   Herbert Pun, MD  Electronically signed by Herbert Pun, MD

## 2018-02-16 ENCOUNTER — Other Ambulatory Visit: Payer: Self-pay | Admitting: General Surgery

## 2018-02-16 DIAGNOSIS — C50212 Malignant neoplasm of upper-inner quadrant of left female breast: Secondary | ICD-10-CM

## 2018-02-16 DIAGNOSIS — Z170 Estrogen receptor positive status [ER+]: Principal | ICD-10-CM

## 2018-02-17 ENCOUNTER — Inpatient Hospital Stay: Payer: Medicare Other | Attending: Oncology | Admitting: Oncology

## 2018-02-17 ENCOUNTER — Encounter: Payer: Self-pay | Admitting: Oncology

## 2018-02-17 ENCOUNTER — Other Ambulatory Visit: Payer: Self-pay | Admitting: General Surgery

## 2018-02-17 ENCOUNTER — Encounter
Admission: RE | Admit: 2018-02-17 | Discharge: 2018-02-17 | Disposition: A | Discharge status: Home / Self Care | Payer: Medicare Other | Source: Ambulatory Visit | Attending: General Surgery | Admitting: General Surgery

## 2018-02-17 ENCOUNTER — Other Ambulatory Visit: Payer: Self-pay

## 2018-02-17 VITALS — BP 145/68 | HR 72 | Temp 97.7°F | Resp 18 | Ht 61.0 in | Wt 125.7 lb

## 2018-02-17 DIAGNOSIS — Z170 Estrogen receptor positive status [ER+]: Secondary | ICD-10-CM

## 2018-02-17 DIAGNOSIS — C50212 Malignant neoplasm of upper-inner quadrant of left female breast: Secondary | ICD-10-CM

## 2018-02-17 DIAGNOSIS — C50912 Malignant neoplasm of unspecified site of left female breast: Secondary | ICD-10-CM

## 2018-02-17 DIAGNOSIS — C50919 Malignant neoplasm of unspecified site of unspecified female breast: Secondary | ICD-10-CM

## 2018-02-17 HISTORY — DX: Dysphonia: R49.0

## 2018-02-17 HISTORY — DX: Cardiac arrhythmia, unspecified: I49.9

## 2018-02-17 HISTORY — DX: Hypothyroidism, unspecified: E03.9

## 2018-02-17 NOTE — Progress Notes (Signed)
Hematology/Oncology Consult note [facility] Telephone:([phone] [phone]   Patient Care Team: Juluis Pitch, MD as PCP - General (Family Medicine)  REFERRING PROVIDER:  CHIEF COMPLAINTS/REASON FOR VISIT:  Evaluation of breast cancer  HISTORY OF PRESENTING ILLNESS:  Amanda Mann is a  84 y.o.  female with PMH listed below who was referred to me for evaluation of newly discovered left breast cancer.  Patient had routine screening mammogram on 01/12/2018 which showed possible mass left breast warrants evaluation. Patient underwent unilateral left diagnostic mammogram on 01/29/2018 and ultrasound which showed 0.4 x 0.2 x 0.2 cm left breast mass.  02/05/2018 ultrasound guided of the breast mass biopsy  pathology showed: Invasive mammary carcinoma, no special type.  Grade 1, ER 100% positive, PR 100% positive, HER-2 negative. Patient has been seen by Dr. Peyton Najjar for surgical evaluation.  She is interested in lumpectomy  Nipple discharge: Denies Family history: Sister was diagnosed with breast cancer at age of 4. OCP use: denies.  Estrogen and progesterone therapy: Remote estrogen replacement therapy for about 4 to 7 years. History of radiation to chest: denies.  Previous breast surgery: Previous bilateral breast biopsy Previous history of melanoma Patient remains active despite her advanced age.  Independent and able to carry ADLs and IADLs.  Lives with husband.  Review of Systems  Constitutional: Negative for appetite change, chills, fatigue and fever.  HENT:   Negative for hearing loss and voice change.   Eyes: Negative for eye problems.  Respiratory: Negative for chest tightness and cough.   Cardiovascular: Negative for chest pain.  Gastrointestinal: Negative for abdominal distention, abdominal pain and blood in stool.  Endocrine: Negative for hot flashes.  Genitourinary: Negative for difficulty urinating and frequency.   Musculoskeletal:  Negative for arthralgias.  Skin: Negative for itching and rash.  Neurological: Negative for extremity weakness.  Hematological: Negative for adenopathy.  Psychiatric/Behavioral: Negative for confusion.    MEDICAL HISTORY:  Past Medical History:  Diagnosis Date  . Arthritis   . Cancer ([place])    melanoma  . Hypertension   . Irritable bowel syndrome   . Thyroid disease     SURGICAL HISTORY: Past Surgical History:  Procedure Laterality Date  . ABDOMINAL HYSTERECTOMY     total  . BREAST BIOPSY Left 02/05/2018   left breast u/s core path pending  . BREAST EXCISIONAL BIOPSY Bilateral    neg  . CHOLECYSTECTOMY    . HEMORRHOID SURGERY      SOCIAL HISTORY: Social History   Socioeconomic History  . Marital status: Married    Spouse name: Caprice Renshaw   . Number of children: Not on file  . Years of education: Not on file  . Highest education level: Not on file  Occupational History  . Occupation: retired  Scientific laboratory technician  . Financial resource strain: Not on file  . Food insecurity:    Worry: Not on file    Inability: Not on file  . Transportation needs:    Medical: Not on file    Non-medical: Not on file  Tobacco Use  . Smoking status: Never Smoker  . Smokeless tobacco: Never Used  Substance and Sexual Activity  . Alcohol use: Not Currently  . Drug use: Never  . Sexual activity: Not on file  Lifestyle  . Physical activity:    Days per week: Not on file    Minutes per session: Not on file  . Stress: Not on file  Relationships  . Social connections:  Talks on phone: Not on file    Gets together: Not on file    Attends religious service: Not on file    Active member of club or organization: Not on file    Attends meetings of clubs or organizations: Not on file    Relationship status: Not on file  . Intimate partner violence:    Fear of current or ex partner: Not on file    Emotionally abused: Not on file    Physically abused: Not on file    Forced sexual  activity: Not on file  Other Topics Concern  . Not on file  Social History Narrative  . Not on file    FAMILY HISTORY: Family History  Problem Relation Age of Onset  . Breast cancer Sister 32  . Hypertension Mother   . Stroke Mother   . Hypertension Father   . Stroke Father   . Stroke Brother     ALLERGIES:  is allergic to amoxicillin; ibandronic acid; moxifloxacin; and sulfa antibiotics.  MEDICATIONS:  Current Outpatient Medications  Medication Sig Dispense Refill  . atenolol (TENORMIN) 25 MG tablet Take 12.5 mg by mouth 2 (two) times daily.     . Cholecalciferol (VITAMIN D3) 2000 units capsule Take 2,000 Units by mouth daily.     Marland Kitchen levothyroxine (SYNTHROID, LEVOTHROID) 75 MCG tablet Take 75 mcg by mouth daily before breakfast.     . metroNIDAZOLE (METROGEL) 0.75 % gel Apply 1 application topically at bedtime.    . Multiple Vitamin (MULTI-VITAMINS) TABS Take 1 tablet by mouth daily.    Marland Kitchen aspirin EC 81 MG tablet Take 81 mg by mouth daily.      No current facility-administered medications for this visit.      PHYSICAL EXAMINATION: ECOG PERFORMANCE STATUS: 0 - Asymptomatic Vitals:   02/17/18 1057  BP: (!) 145/68  Pulse: 72  Resp: 18  Temp: 97.7 F (36.5 C)   Filed Weights   02/17/18 1057  Weight: 125 lb 11.2 oz (57 kg)    Physical Exam  Constitutional: She is oriented to person, place, and time. No distress.  HENT:  Head: Normocephalic and atraumatic.  Mouth/Throat: Oropharynx is clear and moist.  Eyes: Pupils are equal, round, and reactive to light. EOM are normal. No scleral icterus.  Neck: Normal range of motion. Neck supple.  Cardiovascular: Normal rate, regular rhythm and normal heart sounds.  Pulmonary/Chest: Effort normal. No respiratory distress. She has no wheezes.  Abdominal: Soft. Bowel sounds are normal. She exhibits no distension and no mass. There is no tenderness.  Musculoskeletal: Normal range of motion. She exhibits no edema or deformity.    Neurological: She is alert and oriented to person, place, and time. No cranial nerve deficit. Coordination normal.  Skin: Skin is warm and dry. No rash noted. No erythema.  Psychiatric: She has a normal mood and affect. Her behavior is normal. Thought content normal.  Breast exam was performed in seated and lying down position. Patient is status post breast mass biopsy, bruising at the biopsy site. . I can feel an area of thickening, can be due to post biopsy changes.  . No evidence of left axillary adenopathy. No evidence of any palpable masses or lumps in the right breast. No evidence of right axillary adenopathy     LABORATORY DATA:  I have reviewed the data as listed Lab Results  Component Value Date   WBC 6.4 07/28/2017   HGB 14.9 07/28/2017   HCT 43.5 07/28/2017  MCV 92.2 07/28/2017   PLT 222 07/28/2017   Recent Labs    07/28/17 1556  NA 140  K 3.8  CL 105  CO2 29  GLUCOSE 164*  BUN 17  CREATININE 0.84  CALCIUM 9.3  GFRNONAA 59*  GFRAA >60   Iron/TIBC/Ferritin/ %Sat No results found for: IRON, TIBC, FERRITIN, IRONPCTSAT      ASSESSMENT & PLAN:  1. Invasive carcinoma of breast ([place])    I independently reviewed the patient's mammogram images and agree with findings.  Reviewed patient's pathology reports.  Patient's case was discussed on breast tumor conference on 02/16/2018.  Patient has been seen by Dr. Peyton Najjar and has scheduled to get lumpectomy with sentinel lymph node biopsy. Per radiation oncology Dr. Baruch Gouty, he would not offer adjuvant radiation due to patient's advanced age.  Patient has good performance status and in good general health status..  And strongly ER positive PR positive, T1a disease, grade 1.  Early stage breast cancer may not affect her life expectancy.  Would not offer adjuvant chemotherapy.  We discussed about rationale of adjuvant chronic therapy. Discussed about potential side effects from aromatase inhibitor including bone loss,  pathological fracture, vasomotor symptoms, etc.  We will have another discussion with patient after her surgery with finalized pathology report.   All questions were answered. The patient knows to call the clinic with any problems questions or concerns.  Return of visit: 2 weeks after surgery Thank you for this kind referral and the opportunity to participate in the care of this patient. A copy of today's note is routed to referring provider  Total face to face encounter time for this patient visit was 60 min. >50% of the time was  spent in counseling and coordination of care.    Earlie Server, MD, PhD Hematology Oncology [facility] at [facility] Pager- [phone] 02/17/2018

## 2018-02-17 NOTE — Progress Notes (Signed)
Patient here for initial visit. °

## 2018-02-17 NOTE — Patient Instructions (Signed)
Your procedure is scheduled on:02/23/18  8:20 AM Report to [facility] To find out your arrival time please call [phone] between [place] on 02/20/18 Remember: Instructions that are not followed completely may result in serious medical risk,  up to and including death, or upon the discretion of your surgeon and anesthesiologist your  surgery may need to be rescheduled.     _X__ 1. Do not eat food after midnight the night before your procedure.                 No gum chewing or hard candies. You may drink clear liquids up to 2 hours                 before you are scheduled to arrive for your surgery- DO not drink clear                 liquids within 2 hours of the start of your surgery.                 Clear Liquids include:  water, apple juice without pulp, clear carbohydrate                 drink such as Clearfast of Gatorade, Black Coffee or Tea (Do not add                 anything to coffee or tea).  __X__2.  On the morning of surgery brush your teeth with toothpaste and water, you                may rinse your mouth with mouthwash if you wish.  Do not swallow any toothpaste of mouthwash.     _X__ 3.  No Alcohol for 24 hours before or after surgery.   _X__ 4.  Do Not Smoke or use e-cigarettes For 24 Hours Prior to Your Surgery.                 Do not use any chewable tobacco products for at least 6 hours prior to                 surgery.  ____  5.  Bring all medications with you on the day of surgery if instructed.   __X__  6.  Notify your doctor if there is any change in your medical condition      (cold, fever, infections).     Do not wear jewelry, make-up, hairpins, clips or nail polish. Do not wear lotions, powders, or perfumes. You may wear deodorant. Do not shave 48 hours prior to surgery. Men may shave face and neck. Do not bring valuables to the hospital.    [facility] is not responsible for any belongings or  valuables.  Contacts, dentures or bridgework may not be worn into surgery. Leave your suitcase in the car. After surgery it may be brought to your room. For patients admitted to the hospital, discharge time is determined by your treatment team.   Patients discharged the day of surgery will not be allowed to drive home.   Please read over the following fact sheets that you were given:   Surgical Site Infection Prevention    __X__ Take these medicines the morning of surgery with A SIP OF WATER:    1. ATENOLOL  2. LEVOTHYROXINE  3.   4.  5.  6.  ____ Fleet Enema (as directed)   __X__ Use CHG Soap as directed  ____  Use inhalers on the day of surgery  ____ Stop metformin 2 days prior to surgery    ____ Take 1/2 of usual insulin dose the night before surgery. No insulin the morning          of surgery.   _X___ Stop Coumadin/Plavix/aspirin on    ASPIRIN STOPPED 2 WEEKS AGO ____ Stop Anti-inflammatories on    ____ Stop supplements until after surgery.    ____ Bring C-Pap to the hospital.

## 2018-02-18 ENCOUNTER — Encounter: Payer: Self-pay | Admitting: *Deleted

## 2018-02-22 DIAGNOSIS — C50919 Malignant neoplasm of unspecified site of unspecified female breast: Secondary | ICD-10-CM

## 2018-02-22 HISTORY — DX: Malignant neoplasm of unspecified site of unspecified female breast: C50.919

## 2018-02-22 MED ORDER — CLINDAMYCIN PHOSPHATE 900 MG/50ML IV SOLN
900.0000 mg | INTRAVENOUS | Status: AC
  Administered 2018-02-23: 900 mg via INTRAVENOUS

## 2018-02-23 ENCOUNTER — Ambulatory Visit: Payer: Medicare Other | Admitting: Anesthesiology

## 2018-02-23 ENCOUNTER — Ambulatory Visit
Admission: RE | Admit: 2018-02-23 | Discharge: 2018-02-23 | Disposition: A | Discharge status: Home / Self Care | Payer: Medicare Other | Source: Ambulatory Visit | Attending: General Surgery | Admitting: General Surgery

## 2018-02-23 ENCOUNTER — Other Ambulatory Visit: Payer: Self-pay

## 2018-02-23 ENCOUNTER — Encounter
Admission: RE | Admit: 2018-02-23 | Discharge: 2018-02-23 | Disposition: A | Discharge status: Home / Self Care | Payer: Medicare Other | Source: Ambulatory Visit | Attending: General Surgery | Admitting: General Surgery

## 2018-02-23 ENCOUNTER — Encounter
Admission: RE | Disposition: A | Discharge status: Home / Self Care | Payer: Self-pay | Source: Ambulatory Visit | Attending: General Surgery

## 2018-02-23 DIAGNOSIS — Z882 Allergy status to sulfonamides status: Secondary | ICD-10-CM | POA: Diagnosis not present

## 2018-02-23 DIAGNOSIS — C50212 Malignant neoplasm of upper-inner quadrant of left female breast: Secondary | ICD-10-CM

## 2018-02-23 DIAGNOSIS — E559 Vitamin D deficiency, unspecified: Secondary | ICD-10-CM | POA: Diagnosis not present

## 2018-02-23 DIAGNOSIS — Z8711 Personal history of peptic ulcer disease: Secondary | ICD-10-CM | POA: Diagnosis not present

## 2018-02-23 DIAGNOSIS — Z170 Estrogen receptor positive status [ER+]: Secondary | ICD-10-CM | POA: Insufficient documentation

## 2018-02-23 DIAGNOSIS — I10 Essential (primary) hypertension: Secondary | ICD-10-CM | POA: Diagnosis not present

## 2018-02-23 DIAGNOSIS — C50912 Malignant neoplasm of unspecified site of left female breast: Secondary | ICD-10-CM | POA: Insufficient documentation

## 2018-02-23 DIAGNOSIS — Z880 Allergy status to penicillin: Secondary | ICD-10-CM | POA: Diagnosis not present

## 2018-02-23 DIAGNOSIS — Z7982 Long term (current) use of aspirin: Secondary | ICD-10-CM | POA: Insufficient documentation

## 2018-02-23 DIAGNOSIS — E039 Hypothyroidism, unspecified: Secondary | ICD-10-CM | POA: Insufficient documentation

## 2018-02-23 DIAGNOSIS — Z85820 Personal history of malignant melanoma of skin: Secondary | ICD-10-CM | POA: Diagnosis not present

## 2018-02-23 DIAGNOSIS — Z79899 Other long term (current) drug therapy: Secondary | ICD-10-CM | POA: Diagnosis not present

## 2018-02-23 HISTORY — PX: PARTIAL MASTECTOMY WITH NEEDLE LOCALIZATION: SHX6008

## 2018-02-23 HISTORY — PX: BREAST LUMPECTOMY: SHX2

## 2018-02-23 HISTORY — PX: BREAST EXCISIONAL BIOPSY: SUR124

## 2018-02-23 HISTORY — PX: SENTINEL NODE BIOPSY: SHX6608

## 2018-02-23 SURGERY — PARTIAL MASTECTOMY WITH NEEDLE LOCALIZATION
Anesthesia: General | Laterality: Left

## 2018-02-23 MED ORDER — PROPOFOL 10 MG/ML IV BOLUS
INTRAVENOUS | Status: DC | PRN
  Administered 2018-02-23: 50 mg via INTRAVENOUS
  Administered 2018-02-23: 30 mg via INTRAVENOUS

## 2018-02-23 MED ORDER — TECHNETIUM TC 99M SULFUR COLLOID FILTERED
0.8430 | Freq: Once | INTRAVENOUS | Status: AC | PRN
  Administered 2018-02-23: 0.843 via INTRADERMAL

## 2018-02-23 MED ORDER — CEFAZOLIN SODIUM-DEXTROSE 2-4 GM/100ML-% IV SOLN
INTRAVENOUS | Status: AC
  Filled 2018-02-23: qty 100

## 2018-02-23 MED ORDER — LACTATED RINGERS IV SOLN
INTRAVENOUS | Status: DC
  Administered 2018-02-23: 10:00:00 via INTRAVENOUS

## 2018-02-23 MED ORDER — LIDOCAINE HCL (CARDIAC) PF 100 MG/5ML IV SOSY
PREFILLED_SYRINGE | INTRAVENOUS | Status: DC | PRN
  Administered 2018-02-23: 40 mg via INTRAVENOUS

## 2018-02-23 MED ORDER — FAMOTIDINE 20 MG PO TABS
20.0000 mg | ORAL_TABLET | Freq: Once | ORAL | Status: AC
  Administered 2018-02-23: 20 mg via ORAL

## 2018-02-23 MED ORDER — DEXAMETHASONE SODIUM PHOSPHATE 10 MG/ML IJ SOLN
INTRAMUSCULAR | Status: DC | PRN
  Administered 2018-02-23: 5 mg via INTRAVENOUS

## 2018-02-23 MED ORDER — BUPIVACAINE-EPINEPHRINE (PF) 0.5% -1:200000 IJ SOLN
INTRAMUSCULAR | Status: AC
  Filled 2018-02-23: qty 30

## 2018-02-23 MED ORDER — FENTANYL CITRATE (PF) 100 MCG/2ML IJ SOLN: 25.0000 ug | INTRAMUSCULAR | Status: DC | PRN

## 2018-02-23 MED ORDER — FENTANYL CITRATE (PF) 100 MCG/2ML IJ SOLN
INTRAMUSCULAR | Status: DC | PRN
  Administered 2018-02-23 (×2): 50 ug via INTRAVENOUS

## 2018-02-23 MED ORDER — SEVOFLURANE IN SOLN
RESPIRATORY_TRACT | Status: AC
  Filled 2018-02-23: qty 250

## 2018-02-23 MED ORDER — BUPIVACAINE-EPINEPHRINE 0.5% -1:200000 IJ SOLN
INTRAMUSCULAR | Status: DC | PRN
  Administered 2018-02-23: 8 mL

## 2018-02-23 MED ORDER — FENTANYL CITRATE (PF) 100 MCG/2ML IJ SOLN
INTRAMUSCULAR | Status: AC
  Filled 2018-02-23: qty 2

## 2018-02-23 MED ORDER — FAMOTIDINE 20 MG PO TABS
ORAL_TABLET | ORAL | Status: AC
  Filled 2018-02-23: qty 1

## 2018-02-23 MED ORDER — ACETAMINOPHEN 10 MG/ML IV SOLN
INTRAVENOUS | Status: AC
  Filled 2018-02-23: qty 100

## 2018-02-23 MED ORDER — ONDANSETRON HCL 4 MG/2ML IJ SOLN
INTRAMUSCULAR | Status: DC | PRN
  Administered 2018-02-23: 4 mg via INTRAVENOUS

## 2018-02-23 MED ORDER — HYDROCODONE-ACETAMINOPHEN 5-325 MG PO TABS: 1.0000 | ORAL_TABLET | ORAL | 0 refills | Status: AC | PRN

## 2018-02-23 MED ORDER — MIDAZOLAM HCL 2 MG/2ML IJ SOLN
INTRAMUSCULAR | Status: AC
  Filled 2018-02-23: qty 2

## 2018-02-23 MED ORDER — ONDANSETRON HCL 4 MG/2ML IJ SOLN: 4.0000 mg | Freq: Once | INTRAMUSCULAR | Status: DC | PRN

## 2018-02-23 MED ORDER — CLINDAMYCIN PHOSPHATE 600 MG/50ML IV SOLN
INTRAVENOUS | Status: AC
  Filled 2018-02-23: qty 50

## 2018-02-23 MED ORDER — CLINDAMYCIN PHOSPHATE 900 MG/50ML IV SOLN
INTRAVENOUS | Status: AC
  Filled 2018-02-23: qty 50

## 2018-02-23 MED ORDER — ACETAMINOPHEN 10 MG/ML IV SOLN
INTRAVENOUS | Status: DC | PRN
  Administered 2018-02-23: 1000 mg via INTRAVENOUS

## 2018-02-23 SURGICAL SUPPLY — 39 items
CANISTER SUCT 1200ML W/VALVE (MISCELLANEOUS) ×3 IMPLANT
CHLORAPREP W/TINT 26ML (MISCELLANEOUS) ×3 IMPLANT
CNTNR SPEC 2.5X3XGRAD LEK (MISCELLANEOUS) ×2
CONT SPEC 4OZ STER OR WHT (MISCELLANEOUS) ×4
CONTAINER SPEC 2.5X3XGRAD LEK (MISCELLANEOUS) ×2 IMPLANT
COVER WAND RF STERILE (DRAPES) ×3 IMPLANT
DERMABOND ADVANCED (GAUZE/BANDAGES/DRESSINGS) ×2
DERMABOND ADVANCED .7 DNX12 (GAUZE/BANDAGES/DRESSINGS) ×1 IMPLANT
DEVICE DUBIN SPECIMEN MAMMOGRA (MISCELLANEOUS) ×3 IMPLANT
DRAPE LAPAROTOMY 77X122 PED (DRAPES) ×3 IMPLANT
DRAPE LAPAROTOMY TRNSV 106X77 (MISCELLANEOUS) ×3 IMPLANT
ELECT REM PT RETURN 9FT ADLT (ELECTROSURGICAL) ×3
ELECTRODE REM PT RTRN 9FT ADLT (ELECTROSURGICAL) ×1 IMPLANT
GLOVE BIO SURGEON STRL SZ 6.5 (GLOVE) ×2 IMPLANT
GLOVE BIO SURGEONS STRL SZ 6.5 (GLOVE) ×1
GLOVE BIOGEL M 6.5 STRL (GLOVE) ×3 IMPLANT
GOWN STRL REUS W/ TWL LRG LVL3 (GOWN DISPOSABLE) ×2 IMPLANT
GOWN STRL REUS W/TWL LRG LVL3 (GOWN DISPOSABLE) ×4
KIT TURNOVER KIT A (KITS) ×3 IMPLANT
LABEL OR SOLS (LABEL) ×3 IMPLANT
MARGIN MAP 10MM (MISCELLANEOUS) ×3 IMPLANT
NDL SAFETY ECLIPSE 18X1.5 (NEEDLE) ×1 IMPLANT
NEEDLE HYPO 18GX1.5 SHARP (NEEDLE) ×2
NEEDLE HYPO 22GX1.5 SAFETY (NEEDLE) ×3 IMPLANT
NEEDLE HYPO 25X1 1.5 SAFETY (NEEDLE) ×3 IMPLANT
PACK BASIN MINOR ARMC (MISCELLANEOUS) ×3 IMPLANT
RETRACTOR WOUND ALXS 18CM SML (MISCELLANEOUS) ×1 IMPLANT
RTRCTR WOUND ALEXIS O 18CM SML (MISCELLANEOUS) ×3
SLEVE PROBE SENORX GAMMA FIND (MISCELLANEOUS) ×3 IMPLANT
SUT ETHILON 3-0 FS-10 30 BLK (SUTURE) ×6
SUT MNCRL 4-0 (SUTURE) ×2
SUT MNCRL 4-0 27XMFL (SUTURE) ×1
SUT SILK 2 0 SH (SUTURE) IMPLANT
SUT VIC AB 3-0 SH 27 (SUTURE) ×4
SUT VIC AB 3-0 SH 27X BRD (SUTURE) ×2 IMPLANT
SUTURE EHLN 3-0 FS-10 30 BLK (SUTURE) ×2 IMPLANT
SUTURE MNCRL 4-0 27XMF (SUTURE) ×1 IMPLANT
SYR 10ML LL (SYRINGE) ×3 IMPLANT
WATER STERILE IRR 1000ML POUR (IV SOLUTION) ×3 IMPLANT

## 2018-02-23 NOTE — Anesthesia Preprocedure Evaluation (Signed)
Anesthesia Evaluation  Patient identified by MRN, date of birth, ID band Patient awake    Reviewed: Allergy & Precautions, H&P , NPO status , Patient's Chart, lab work & pertinent test results, reviewed documented beta blocker date and time   History of Anesthesia Complications Negative for: history of anesthetic complications  Airway Mallampati: I  TM Distance: >3 FB Neck ROM: full    Dental  (+) Dental Advidsory Given, Caps, Missing, Teeth Intact   Pulmonary neg pulmonary ROS,           Cardiovascular Exercise Tolerance: Good hypertension, (-) angina(-) CAD, (-) Past MI, (-) Cardiac Stents and (-) CABG + dysrhythmias (-) Valvular Problems/Murmurs     Neuro/Psych negative neurological ROS  negative psych ROS   GI/Hepatic negative GI ROS, Neg liver ROS,   Endo/Other  neg diabetesHypothyroidism   Renal/GU negative Renal ROS  negative genitourinary   Musculoskeletal   Abdominal   Peds  Hematology negative hematology ROS (+)   Anesthesia Other Findings Past Medical History: No date: Arthritis No date: Cancer ([place])     Comment:  melanoma No date: Dysrhythmia     Comment:  chronic tachycardia No date: Hoarseness of voice     Comment:  BIL VC BOWING BY DR MCQUEEN 2017. NOTE ON CHART No date: Hypertension No date: Hypothyroidism No date: Irritable bowel syndrome No date: Thyroid disease   Reproductive/Obstetrics negative OB ROS                             Anesthesia Physical Anesthesia Plan  ASA: III  Anesthesia Plan: General   Post-op Pain Management:    Induction: Intravenous  PONV Risk Score and Plan: 3 and Ondansetron, Dexamethasone and Treatment may vary due to age or medical condition  Airway Management Planned: LMA  Additional Equipment:   Intra-op Plan:   Post-operative Plan: Extubation in OR  Informed Consent: I have reviewed the patients History and Physical,  chart, labs and discussed the procedure including the risks, benefits and alternatives for the proposed anesthesia with the patient or authorized representative who has indicated his/her understanding and acceptance.   Dental Advisory Given  Plan Discussed with: Anesthesiologist, CRNA and Surgeon  Anesthesia Plan Comments:         Anesthesia Quick Evaluation

## 2018-02-23 NOTE — Anesthesia Post-op Follow-up Note (Signed)
Anesthesia QCDR form completed.        

## 2018-02-23 NOTE — Interval H&P Note (Signed)
History and Physical Interval Note:  02/23/2018 1:25 PM  Amanda Mann  has presented today for surgery, with the diagnosis of MALIGNANT NEOPLASM OF LEFT BREAST  The various methods of treatment have been discussed with the patient and family. After consideration of risks, benefits and other options for treatment, the patient has consented to  Procedure(s): PARTIAL MASTECTOMY WITH NEEDLE LOCALIZATION (Left) SENTINEL NODE BIOPSY (Left) as a surgical intervention .  The patient's history has been reviewed, patient examined, no change in status, stable for surgery.  I have reviewed the patient's chart and labs.  Left chest marked in the pre procedure room. Questions were answered to the patient's satisfaction.     Herbert Pun

## 2018-02-23 NOTE — Anesthesia Procedure Notes (Signed)
Procedure Name: LMA Insertion Date/Time: 02/23/2018 3:50 PM Performed by: Nelda Marseille, CRNA Pre-anesthesia Checklist: Patient identified, Patient being monitored, Timeout performed, Emergency Drugs available and Suction available Patient Re-evaluated:Patient Re-evaluated prior to induction Oxygen Delivery Method: Circle system utilized Preoxygenation: Pre-oxygenation with 100% oxygen Induction Type: IV induction Ventilation: Mask ventilation without difficulty LMA: LMA inserted LMA Size: 4.0 Tube type: Oral Number of attempts: 1 Placement Confirmation: positive ETCO2 and breath sounds checked- equal and bilateral Tube secured with: Tape Dental Injury: Teeth and Oropharynx as per pre-operative assessment

## 2018-02-23 NOTE — Op Note (Signed)
Preoperative diagnosis: Left breast carcinoma.  Postoperative diagnosis: Left breast carcinoma..   Procedure: Left needle-localized partial mastectomy.                       Left Axillary Sentinel Lymph node biopsy  Anesthesia: GETA  Surgeon: Dr. Windell Moment  Wound Classification: Clean  Indications: Patient is a 84 y.o. female with a nonpalpable left breast mass noted on mammography with core biopsy demonstrating invasive mammary carcinoma requires needle-localized partial mastectomy for treatment with sentinel lymph node biopsy.   Findings: 1. Specimen mammography shows marker and wire on specimen 2. Pathology call refers gross examination of margins was 72mm from lateral margin 3. No other palpable mass or lymph node identified.   Description of procedure: Preoperative needle localization was performed by radiology. In the nuclear medicine suite, the subareolar region was injected with Tc-99 sulfur colloid. Localization studies were reviewed. The patient was taken to the operating room and placed supine on the operating table, and after general anesthesia the left chest and axilla were prepped and draped in the usual sterile fashion. A time-out was completed verifying correct patient, procedure, site, positioning, and implant(s) and/or special equipment prior to beginning this procedure.  By comparing the localization studies with the direction and skin entry site of the needle, the probable trajectory and location of the mass was visualized. A circumareolar skin incision was planned in such a way as to minimize the amount of dissection to reach the mass.  The skin incision was made. Flaps were raised and the location of the wire confirmed. The wire was delivered into the wound. A 2-0 silk figure-of-eight stay suture was placed around the wire and used for retraction. Dissection was then taken down circumferentially, taking care to include the entire localizing needle and a wide margin of  grossly normal tissue. The specimen and entire localizing wire were removed. The specimen was oriented and sent to radiology with the localization studies. Confirmation was received that the entire target lesion had been resected. Pathology called back and refers that the mass was grossly completely removed with the closest margin being the lateral margin 1 mm from the mass. Re-excision of the lateral margin was done and sent to pathology. The wound was irrigated. Hemostasis was checked. The wound was closed with interrupted sutures of 3-0 Vicryl and a subcuticular suture of Monocryl 3-0. No attempt was made to close the dead space. A dressing was applied.   A hand-held gamma probe was used to identify the location of the hottest spot in the axilla. An incision was made around the caudal axillary hairline. Dissection was carried down until subdermal facias was advanced. The probe was placed and again, the point of maximal count was found. Dissection continue until nodule was identified. The probe was placed in contact with the node. The node was excised in its entirety. Ex vivo, the node measured 1253 counts when placed on the probe. The bed of the node measured 6 counts. No additional hot spots were identified. No clinically abnormal nodes were palpated. The procedure was terminated. Hemostasis was achieved and the wound closed in layers with deep interrupted 3-0 Vicryl and skin was closed with subcuticular suture of Monocryl 3-0.  The patient tolerated the procedure well and was taken to the postanesthesia care unit in stable condition.   Specimen: Left Breast mass (Orientation markers used: Cranial, Medial, Skin)  Sentinel Lymph nodes #1                   Left breast new lateral margin  Complications: None  Estimated Blood Loss: 10 mL

## 2018-02-23 NOTE — Transfer of Care (Signed)
Immediate Anesthesia Transfer of Care Note  Patient: Amanda Mann  Procedure(s) Performed: PARTIAL MASTECTOMY WITH NEEDLE LOCALIZATION (Left ) SENTINEL NODE BIOPSY (Left )  Patient Location: PACU  Anesthesia Type:General  Level of Consciousness: awake and sedated  Airway & Oxygen Therapy: Patient Spontanous Breathing and Patient connected to face mask oxygen  Post-op Assessment: Report given to RN and Post -op Vital signs reviewed and stable  Post vital signs: Reviewed and stable  Last Vitals:  Vitals Value Taken Time  BP    Temp    Pulse 72 02/23/2018  5:13 PM  Resp    SpO2 100 % 02/23/2018  5:13 PM  Vitals shown include unvalidated device data.  Last Pain:  Vitals:   02/23/18 0938  TempSrc: Oral         Complications: No apparent anesthesia complications

## 2018-02-23 NOTE — Discharge Instructions (Signed)
AMBULATORY SURGERY  DISCHARGE INSTRUCTIONS   1) The drugs that you were given will stay in your system until tomorrow so for the next 24 hours you should not:  A) Drive an automobile B) Make any legal decisions C) Drink any alcoholic beverage   2) You may resume regular meals tomorrow.  Today it is better to start with liquids and gradually work up to solid foods.  You may eat anything you prefer, but it is better to start with liquids, then soup and crackers, and gradually work up to solid foods.   3) Please notify your doctor immediately if you have any unusual bleeding, trouble breathing, redness and pain at the surgery site, drainage, fever, or pain not relieved by medication.    4) Additional Instructions:        Please contact your physician with any problems or Same Day Surgery at [phone], Monday through Friday 6 am to 4 pm, or Krystale Rinkenberger at [place] number at [phone]. Diet: Resume home heart healthy regular diet.   Activity: No heavy lifting >20 pounds (children, pets, laundry, garbage) or strenuous activity until follow-up, but light activity and walking are encouraged. Do not drive or drink alcohol if taking narcotic pain medications.  Wound care: May shower with soapy water and pat dry (do not rub incisions), but no baths or submerging incision underwater until follow-up. (no swimming)   Medications: Resume all home medications. For mild to moderate pain: acetaminophen (Tylenol) or ibuprofen (if no kidney disease). Combining Tylenol with alcohol can substantially increase your risk of causing liver disease. Narcotic pain medications, if prescribed, can be used for severe pain, though may cause nausea, constipation, and drowsiness. Do not combine Tylenol and Norco within a 6 hour period as Norco contains Tylenol. If you do not need the narcotic pain medication, you do not need to fill the prescription.  Call office ([phone]) at any time if any  questions, worsening pain, fevers/chills, bleeding, drainage from incision site, or other concerns.  

## 2018-02-23 NOTE — Anesthesia Postprocedure Evaluation (Signed)
Anesthesia Post Note  Patient: Amanda Mann  Procedure(s) Performed: PARTIAL MASTECTOMY WITH NEEDLE LOCALIZATION (Left ) SENTINEL NODE BIOPSY (Left )  Patient location during evaluation: PACU Anesthesia Type: General Level of consciousness: awake and alert Pain management: pain level controlled Vital Signs Assessment: post-procedure vital signs reviewed and stable Respiratory status: spontaneous breathing, nonlabored ventilation, respiratory function stable and patient connected to nasal cannula oxygen Cardiovascular status: blood pressure returned to baseline and stable Postop Assessment: no apparent nausea or vomiting Anesthetic complications: no     Last Vitals:  Vitals:   02/23/18 1749 02/23/18 1804  BP: (!) 167/71 (!) 161/66  Pulse: 70 68  Resp: 13 16  Temp: 36.9 C 36.8 C  SpO2: 100% 99%    Last Pain:  Vitals:   02/23/18 1804  TempSrc: Temporal  PainSc: 2                  Precious Haws [name]

## 2018-02-24 ENCOUNTER — Encounter: Payer: Self-pay | Admitting: General Surgery

## 2018-02-25 LAB — SURGICAL PATHOLOGY

## 2018-03-09 ENCOUNTER — Other Ambulatory Visit: Payer: Self-pay

## 2018-03-09 ENCOUNTER — Inpatient Hospital Stay: Payer: Medicare Other | Attending: Oncology | Admitting: Oncology

## 2018-03-09 ENCOUNTER — Encounter: Payer: Self-pay | Admitting: Oncology

## 2018-03-09 VITALS — BP 152/65 | HR 77 | Temp 97.4°F | Wt 126.4 lb

## 2018-03-09 DIAGNOSIS — C50919 Malignant neoplasm of unspecified site of unspecified female breast: Secondary | ICD-10-CM

## 2018-03-09 DIAGNOSIS — Z7189 Other specified counseling: Secondary | ICD-10-CM

## 2018-03-09 DIAGNOSIS — Z170 Estrogen receptor positive status [ER+]: Secondary | ICD-10-CM | POA: Insufficient documentation

## 2018-03-09 DIAGNOSIS — C50912 Malignant neoplasm of unspecified site of left female breast: Secondary | ICD-10-CM | POA: Insufficient documentation

## 2018-03-09 NOTE — Progress Notes (Signed)
Hematology/Oncology follow up note [facility] Telephone:([phone] [phone]) [phone]   Patient Care Team: Juluis Pitch, MD as PCP - General (Family Medicine)  REFERRING PROVIDER:  CHIEF COMPLAINTS/REASON FOR VISIT:  Evaluation of breast cancer  HISTORY OF PRESENTING ILLNESS:  Amanda Mann is a  84 y.o.  female with PMH listed below who was referred to me for evaluation of newly discovered left breast cancer.  Patient had routine screening mammogram on 01/12/2018 which showed possible mass left breast warrants evaluation. Patient underwent unilateral left diagnostic mammogram on 01/29/2018 and ultrasound which showed 0.4 x 0.2 x 0.2 cm left breast mass.  02/05/2018 ultrasound guided of the breast mass biopsy  pathology showed: Invasive mammary carcinoma, no special type.  Grade 1, ER 100% positive, PR 100% positive, HER-2 negative. Patient has been seen by Dr. Peyton Najjar for surgical evaluation.  She is interested in lumpectomy  Nipple discharge: Denies Family history: Sister was diagnosed with breast cancer at age of 61. OCP use: denies.  Estrogen and progesterone therapy: Remote estrogen replacement therapy for about 4 to 7 years. History of radiation to chest: denies.  Previous breast surgery: Previous bilateral breast biopsy Previous history of melanoma Patient remains active despite her advanced age.  Independent and able to carry ADLs and IADLs.  Lives with husband.  INTERVAL HISTORY Amanda Mann is a 84 y.o. female who has above history reviewed by me today presents for follow up visit for management of breast cancer.  During the interval, she underwent left breast lumpectomy, pathology showed no residule carcinoma identified. Margins negative.  Previous breast biopsy showed 49m invasive carcinoma.  pT1a pN0  Today she was accompanied by her husband. Reports doing well. Some soreness around surgical wound.   Review of Systems    Constitutional: Negative for appetite change, chills, fatigue and fever.  HENT:   Negative for hearing loss and voice change.   Eyes: Negative for eye problems.  Respiratory: Negative for chest tightness and cough.   Cardiovascular: Negative for chest pain.  Gastrointestinal: Negative for abdominal distention, abdominal pain and blood in stool.  Endocrine: Negative for hot flashes.  Genitourinary: Negative for difficulty urinating and frequency.   Musculoskeletal: Negative for arthralgias.  Skin: Negative for itching and rash.  Neurological: Negative for extremity weakness.  Hematological: Negative for adenopathy.  Psychiatric/Behavioral: Negative for confusion.    MEDICAL HISTORY:  Past Medical History:  Diagnosis Date  . Arthritis   . [place] ([place]    melanoma  . Dysrhythmia    chronic tachycardia  . Hoarseness of voice    BIL VC BOWING BY DR MTami Ribas2017. NOTE ON CHART  . Hypertension   . Hypothyroidism   . Irritable bowel syndrome   . Thyroid disease     SURGICAL HISTORY: Past Surgical History:  Procedure Laterality Date  . ABDOMINAL HYSTERECTOMY     total  . BREAST BIOPSY Left 02/05/2018   left breast u/s core invasive mam ca  . BREAST EXCISIONAL BIOPSY Bilateral    neg  . BREAST EXCISIONAL BIOPSY Left 02/23/2018   wire placement left breast  . BREAST LUMPECTOMY Left 02/23/2018  . CHOLECYSTECTOMY    . HEMORRHOID SURGERY    . PARTIAL MASTECTOMY WITH NEEDLE LOCALIZATION Left 02/23/2018   Procedure: PARTIAL MASTECTOMY WITH NEEDLE LOCALIZATION;  Surgeon: CHerbert Pun MD;  Location: [facility] ORS;  Service: General;  Laterality: Left;  . SENTINEL NODE BIOPSY Left 02/23/2018   Procedure: SENTINEL NODE BIOPSY;  Surgeon: CHerbert Pun MD;  Location: [facility] ORS;  Service: General;  Laterality: Left;    SOCIAL HISTORY: Social History   Socioeconomic History  . Marital status: Married    Spouse name: Caprice Renshaw   . Number of children: Not on file  . Years  of education: Not on file  . Highest education level: Not on file  Occupational History  . Occupation: retired  Scientific laboratory technician  . Financial resource strain: Not on file  . Food insecurity:    Worry: Not on file    Inability: Not on file  . Transportation needs:    Medical: Not on file    Non-medical: Not on file  Tobacco Use  . Smoking status: Never Smoker  . Smokeless tobacco: Never Used  Substance and Sexual Activity  . Alcohol use: Not Currently  . Drug use: Never  . Sexual activity: Not on file  Lifestyle  . Physical activity:    Days per week: Not on file    Minutes per session: Not on file  . Stress: Not on file  Relationships  . Social connections:    Talks on phone: Not on file    Gets together: Not on file    Attends religious service: Not on file    Active member of club or organization: Not on file    Attends meetings of clubs or organizations: Not on file    Relationship status: Not on file  . Intimate partner violence:    Fear of current or ex partner: Not on file    Emotionally abused: Not on file    Physically abused: Not on file    Forced sexual activity: Not on file  Other Topics Concern  . Not on file  Social History Narrative  . Not on file    FAMILY HISTORY: Family History  Problem Relation Age of Onset  . Breast cancer Sister 31  . Hypertension Mother   . Stroke Mother   . Hypertension Father   . Stroke Father   . Stroke Brother     ALLERGIES:  is allergic to amoxicillin; ibandronic acid; moxifloxacin; and sulfa antibiotics.  MEDICATIONS:  Current Outpatient Medications  Medication Sig Dispense Refill  . aspirin EC 81 MG tablet Take 81 mg by mouth daily.     Marland Kitchen atenolol (TENORMIN) 25 MG tablet Take 12.5 mg by mouth 2 (two) times daily.     . Cholecalciferol (VITAMIN D3) 2000 units capsule Take 2,000 Units by mouth daily.     Marland Kitchen levothyroxine (SYNTHROID, LEVOTHROID) 75 MCG tablet Take 75 mcg by mouth daily before breakfast.     .  metroNIDAZOLE (METROGEL) 0.75 % gel Apply 1 application topically at bedtime.    . Multiple Vitamin (MULTI-VITAMINS) TABS Take 1 tablet by mouth daily.     No current facility-administered medications for this visit.      PHYSICAL EXAMINATION: ECOG PERFORMANCE STATUS: 0 - Asymptomatic Vitals:   03/09/18 1331 03/09/18 1338  BP: (!) 152/65   Pulse:  77  Temp: (!) 97.4 F (36.3 C)    Filed Weights   03/09/18 1331  Weight: 126 lb 6.4 oz (57.3 kg)    Physical Exam Constitutional:      General: She is not in acute distress. HENT:     Head: Normocephalic and atraumatic.  Eyes:     General: No scleral icterus.    Pupils: Pupils are equal, round, and reactive to light.  Neck:     Musculoskeletal: Normal range of motion and neck supple.  Cardiovascular:     Rate and Rhythm: Normal rate and regular rhythm.     Heart sounds: Normal heart sounds.  Pulmonary:     Effort: Pulmonary effort is normal. No respiratory distress.     Breath sounds: No wheezing.  Abdominal:     General: Bowel sounds are normal. There is no distension.     Palpations: Abdomen is soft. There is no mass.     Tenderness: There is no abdominal tenderness.  Musculoskeletal: Normal range of motion.        General: No deformity.  Skin:    General: Skin is warm and dry.     Findings: No erythema or rash.  Neurological:     Mental Status: She is alert and oriented to person, place, and time.     Cranial Nerves: No cranial nerve deficit.     Coordination: Coordination normal.  Psychiatric:        Behavior: Behavior normal.        Thought Content: Thought content normal.        LABORATORY DATA:  I have reviewed the data as listed Lab Results  Component Value Date   WBC 6.4 07/28/2017   HGB 14.9 07/28/2017   HCT 43.5 07/28/2017   MCV 92.2 07/28/2017   PLT 222 07/28/2017   Recent Labs    07/28/17 1556  NA 140  K 3.8  CL 105  CO2 29  GLUCOSE 164*  BUN 17  CREATININE 0.84  CALCIUM 9.3    GFRNONAA 59*  GFRAA >60   Iron/TIBC/Ferritin/ %Sat No results found for: IRON, TIBC, FERRITIN, IRONPCTSAT      ASSESSMENT & PLAN:  1. Invasive carcinoma of breast ([place])   2. Goals of care, counseling/discussion   Cancer Staging Invasive carcinoma of breast ([place]) Staging form: Breast, AJCC 8th Edition - Clinical: No stage assigned - Unsigned - Pathologic stage from 03/09/2018: Stage IA (pT1a, pN0, cM0, G1, ER+, PR+, HER2-) - Signed by Earlie Server, MD on 03/09/2018   Pathology was reviewed and discussed with patient.  She has Stage IA invasive carcinoma of breast.   Discussed with patient about rationale and side effects of adjuvant estrogen treatment Patient is not interested. Given her advanced age and average life expectancy, I think it is reasonable to hold any adjuvant treatments.  I discussed with her that if she is not interested in additional surgical intervention or anti estrogen treatments, then I would recommend stopping additional mammogram. If she is open for intervention in the future if more radiographic findings are discovered in the future, then she will need annual mammogram. Will discuss at that point.  We spent sufficient time to discuss many aspect of care, questions were answered to patient's satisfaction.  The patient knows to call the clinic with any problems questions or concerns.  Return of visit: 1 year.  Total face to face encounter time for this patient visit was 25 min. >50% of the time was  spent in counseling and coordination of care.   Earlie Server, MD, PhD Hematology Oncology [facility] Pager- [phone] 03/09/2018

## 2018-03-09 NOTE — Progress Notes (Signed)
Patient here for follow up. No concerns voiced.  °

## 2018-03-10 DIAGNOSIS — Z7189 Other specified counseling: Secondary | ICD-10-CM | POA: Insufficient documentation

## 2018-03-24 NOTE — Addendum Note (Signed)
Encounter addended by: Nona Dell, RT on: 03/24/2018 2:07 PM  Actions taken: Imaging Exam begun

## 2018-09-07 ENCOUNTER — Ambulatory Visit: Payer: Medicare Other | Admitting: Oncology

## 2018-12-29 ENCOUNTER — Other Ambulatory Visit: Payer: Self-pay | Admitting: General Surgery

## 2018-12-29 DIAGNOSIS — Z853 Personal history of malignant neoplasm of breast: Secondary | ICD-10-CM

## 2019-01-27 ENCOUNTER — Ambulatory Visit
Admission: RE | Admit: 2019-01-27 | Discharge: 2019-01-27 | Disposition: A | Discharge status: Home / Self Care | Payer: Medicare Other | Source: Ambulatory Visit | Attending: General Surgery | Admitting: General Surgery

## 2019-01-27 DIAGNOSIS — Z853 Personal history of malignant neoplasm of breast: Secondary | ICD-10-CM

## 2020-01-03 ENCOUNTER — Other Ambulatory Visit: Payer: Self-pay | Admitting: General Surgery

## 2020-01-03 DIAGNOSIS — Z853 Personal history of malignant neoplasm of breast: Secondary | ICD-10-CM

## 2020-01-28 ENCOUNTER — Ambulatory Visit
Admission: RE | Admit: 2020-01-28 | Discharge: 2020-01-28 | Disposition: A | Discharge status: Home / Self Care | Payer: Medicare PPO | Source: Ambulatory Visit | Attending: General Surgery | Admitting: General Surgery

## 2020-01-28 ENCOUNTER — Other Ambulatory Visit: Payer: Self-pay

## 2020-01-28 DIAGNOSIS — Z853 Personal history of malignant neoplasm of breast: Secondary | ICD-10-CM | POA: Diagnosis present

## 2020-01-31 ENCOUNTER — Other Ambulatory Visit: Payer: Self-pay | Admitting: General Surgery

## 2020-01-31 DIAGNOSIS — R928 Other abnormal and inconclusive findings on diagnostic imaging of breast: Secondary | ICD-10-CM

## 2020-01-31 DIAGNOSIS — N6310 Unspecified lump in the right breast, unspecified quadrant: Secondary | ICD-10-CM

## 2020-02-08 ENCOUNTER — Ambulatory Visit
Admission: RE | Admit: 2020-02-08 | Discharge: 2020-02-08 | Disposition: A | Discharge status: Home / Self Care | Payer: Medicare PPO | Source: Ambulatory Visit | Attending: General Surgery | Admitting: General Surgery

## 2020-02-08 ENCOUNTER — Other Ambulatory Visit: Payer: Self-pay

## 2020-02-08 DIAGNOSIS — R928 Other abnormal and inconclusive findings on diagnostic imaging of breast: Secondary | ICD-10-CM

## 2020-02-08 DIAGNOSIS — N6310 Unspecified lump in the right breast, unspecified quadrant: Secondary | ICD-10-CM

## 2020-02-08 HISTORY — PX: BREAST BIOPSY: SHX20

## 2020-02-09 LAB — SURGICAL PATHOLOGY

## 2020-02-10 ENCOUNTER — Other Ambulatory Visit: Payer: Self-pay

## 2020-02-10 ENCOUNTER — Ambulatory Visit: Payer: Medicare Other | Admitting: Dermatology

## 2020-02-10 ENCOUNTER — Encounter: Payer: Self-pay | Admitting: Dermatology

## 2020-02-10 DIAGNOSIS — L578 Other skin changes due to chronic exposure to nonionizing radiation: Secondary | ICD-10-CM

## 2020-02-10 DIAGNOSIS — L719 Rosacea, unspecified: Secondary | ICD-10-CM | POA: Diagnosis not present

## 2020-02-10 DIAGNOSIS — L821 Other seborrheic keratosis: Secondary | ICD-10-CM

## 2020-02-10 DIAGNOSIS — Z85820 Personal history of malignant melanoma of skin: Secondary | ICD-10-CM

## 2020-02-10 DIAGNOSIS — Z86018 Personal history of other benign neoplasm: Secondary | ICD-10-CM | POA: Diagnosis not present

## 2020-02-10 DIAGNOSIS — D1800 Hemangioma unspecified site: Secondary | ICD-10-CM

## 2020-02-10 DIAGNOSIS — Z1283 Encounter for screening for malignant neoplasm of skin: Secondary | ICD-10-CM

## 2020-02-10 DIAGNOSIS — D229 Melanocytic nevi, unspecified: Secondary | ICD-10-CM

## 2020-02-10 DIAGNOSIS — L814 Other melanin hyperpigmentation: Secondary | ICD-10-CM

## 2020-02-10 DIAGNOSIS — L820 Inflamed seborrheic keratosis: Secondary | ICD-10-CM | POA: Diagnosis not present

## 2020-02-10 MED ORDER — METRONIDAZOLE 0.75 % EX GEL: 1.0000 "application " | Freq: Every day | CUTANEOUS | 11 refills | Status: AC

## 2020-02-10 NOTE — Progress Notes (Signed)
Follow-Up Visit   Subjective  Amanda Mann is a 86 y.o. female who presents for the following: Annual Exam (Total body skin exam, hx of Melanoma L upper arm,hx of Dysplastic Nevus L lat elbow, hx AKs) and Rosacea (face, metronidazole 0.75% gel qd). The patient presents for Total-Body Skin Exam (TBSE) for skin cancer screening and mole check.  The following portions of the chart were reviewed this encounter and updated as appropriate:  Tobacco  Allergies  Meds  Problems  Med Hx  Surg Hx  Fam Hx     Review of Systems:  No other skin or systemic complaints except as noted in HPI or Assessment and Plan.  Objective  Well appearing patient in no apparent distress; mood and affect are within normal limits.  A full examination was performed including scalp, head, eyes, ears, nose, lips, neck, chest, axillae, abdomen, back, buttocks, bilateral upper extremities, bilateral lower extremities, hands, feet, fingers, toes, fingernails, and toenails. All findings within normal limits unless otherwise noted below.  Objective  Left lateral elbow: Scar with no evidence of recurrence.   Objective  Left Upper Arm: Well healed scar with no evidence of recurrence, no lymphadenopathy.   Objective  face: Erythema face  Objective  Right back x 1: Erythematous keratotic or waxy stuck-on papule or plaque.    Assessment & Plan    Lentigines - Scattered tan macules - Discussed due to sun exposure - Benign, observe - Call for any changes  Seborrheic Keratoses - Stuck-on, waxy, tan-brown papules and plaques  - Discussed benign etiology and prognosis. - Observe - Call for any changes  Melanocytic Nevi - Tan-brown and/or pink-flesh-colored symmetric macules and papules - Benign appearing on exam today - Observation - Call clinic for new or changing moles - Recommend daily use of broad spectrum spf 30+ sunscreen to sun-exposed areas.   Hemangiomas - Red papules - Discussed benign  nature - Observe - Call for any changes  Actinic Damage - Chronic, secondary to cumulative UV/sun exposure - diffuse scaly erythematous macules with underlying dyspigmentation - Recommend daily broad spectrum sunscreen SPF 30+ to sun-exposed areas, reapply every 2 hours as needed.  - Call for new or changing lesions.  Skin cancer screening performed today.  History of dysplastic nevus Left lateral elbow  Clear. Observe for recurrence. Call clinic for new or changing lesions.  Recommend regular skin exams, daily broad-spectrum spf 30+ sunscreen use, and photoprotection.     History of melanoma Left Upper Arm  Excised 1990  Clear. Observe for recurrence. Call clinic for new or changing lesions.  Recommend regular skin exams, daily broad-spectrum spf 30+ sunscreen use, and photoprotection.     Rosacea face  Rosacea is a chronic progressive skin condition usually affecting the face of adults. It is treatable but not curable. It sometimes affects the eyes (ocular rosacea) as well. It may respond to topical and/or systemic medication and can flare with stress, sun exposure, alcohol, exercise and some foods.  Controlled  Cont Metrogel 0.75% qhs  metroNIDAZOLE (METROGEL) 0.75 % gel - face  Inflamed seborrheic keratosis Right back x 1  Destruction of lesion - Right back x 1 Complexity: simple   Destruction method: cryotherapy   Informed consent: discussed and consent obtained   Timeout:  patient name, date of birth, surgical site, and procedure verified Lesion destroyed using liquid nitrogen: Yes   Region frozen until ice ball extended beyond lesion: Yes   Outcome: patient tolerated procedure well with no complications  Post-procedure details: wound care instructions given    Skin cancer screening  Return in about 1 year (around 02/09/2021) for TBSE, hx of Melanoma, dysplastic nevus, AK, Rosacea.  I, Othelia Pulling, RMA, am acting as scribe for Sarina Ser, MD  .  Documentation: I have reviewed the above documentation for accuracy and completeness, and I agree with the above.  Sarina Ser, MD

## 2020-02-15 ENCOUNTER — Encounter: Payer: Self-pay | Admitting: Dermatology

## 2020-07-04 ENCOUNTER — Other Ambulatory Visit: Payer: Self-pay | Admitting: General Surgery

## 2020-07-04 DIAGNOSIS — Z853 Personal history of malignant neoplasm of breast: Secondary | ICD-10-CM

## 2020-07-28 ENCOUNTER — Other Ambulatory Visit: Payer: Medicare PPO

## 2020-08-07 ENCOUNTER — Other Ambulatory Visit: Payer: Self-pay | Admitting: Dermatology

## 2020-09-07 IMAGING — MG DIGITAL SCREENING BILATERAL MAMMOGRAM WITH TOMO AND CAD
8 series · 8 of 24 positions shown · non-contrast
Comparison: Previous exam(s).

CLINICAL DATA: Screening.

EXAM:
DIGITAL SCREENING BILATERAL MAMMOGRAM WITH TOMO AND CAD

[L MLO synth-2D]
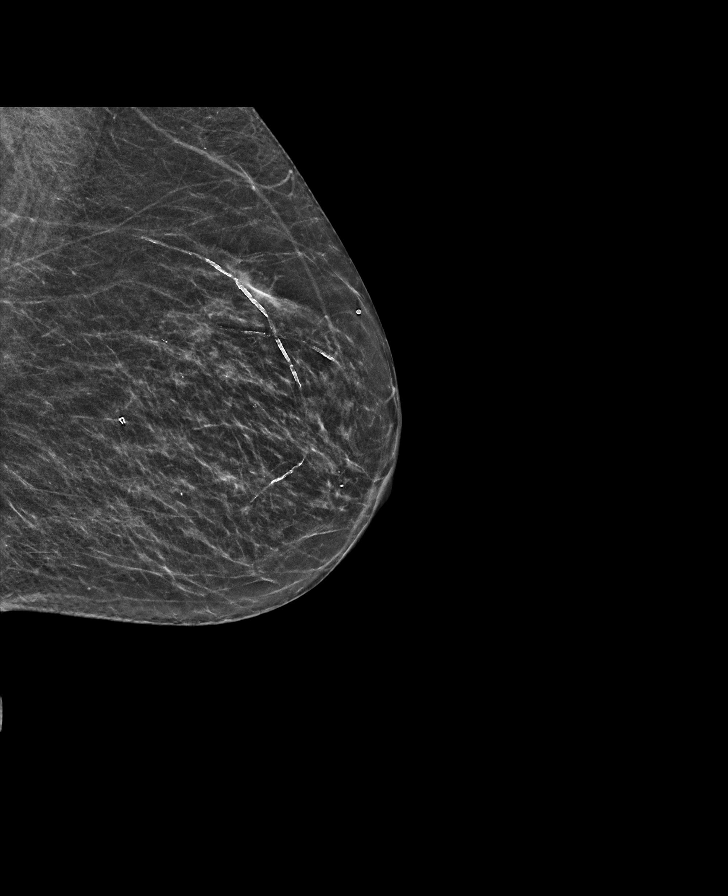

[R MLO synth-2D]
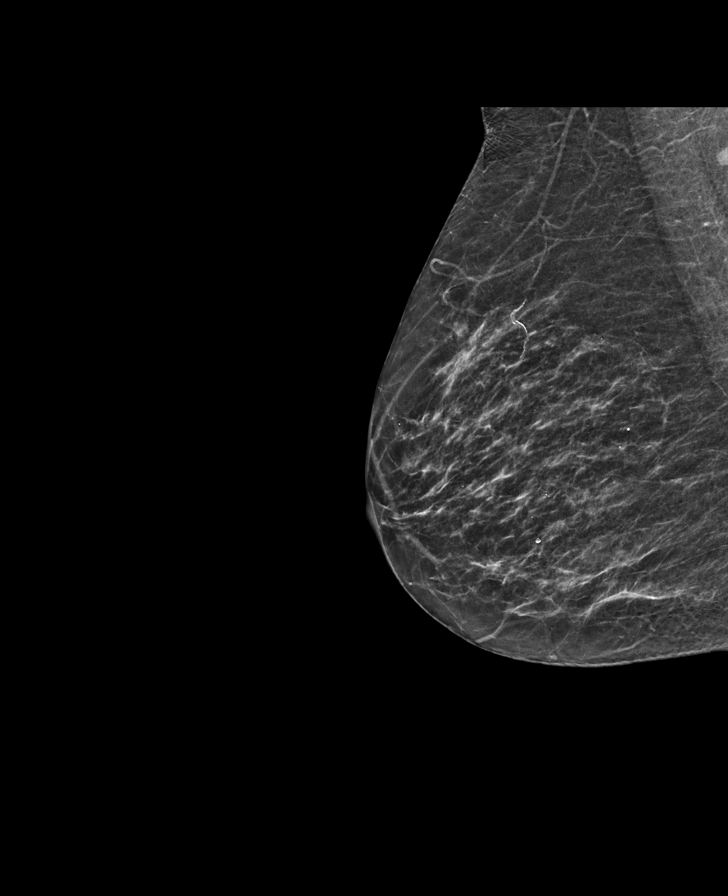

[L CC synth-2D]
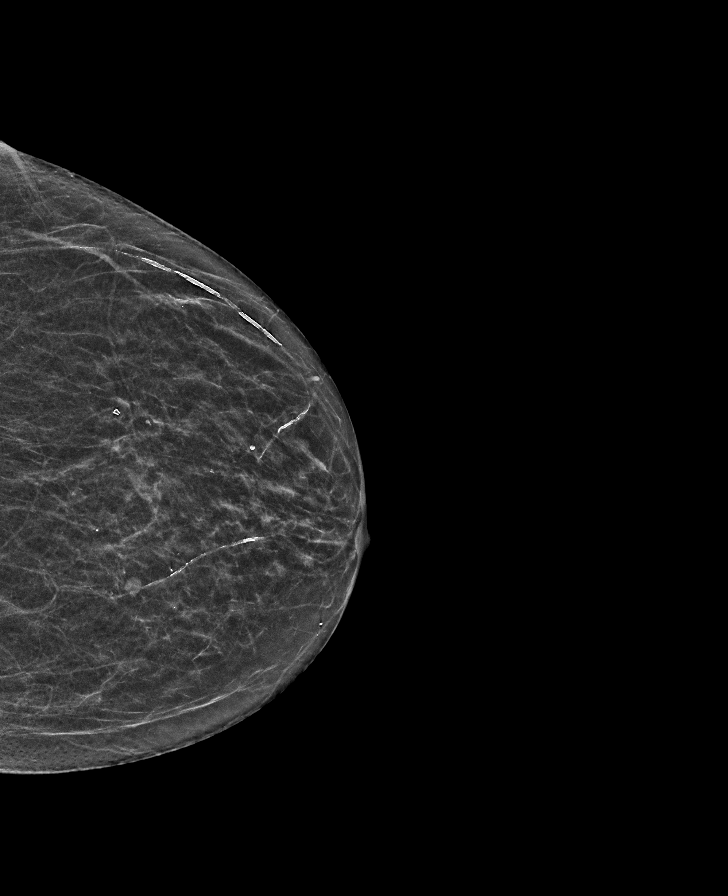

[R CC synth-2D]
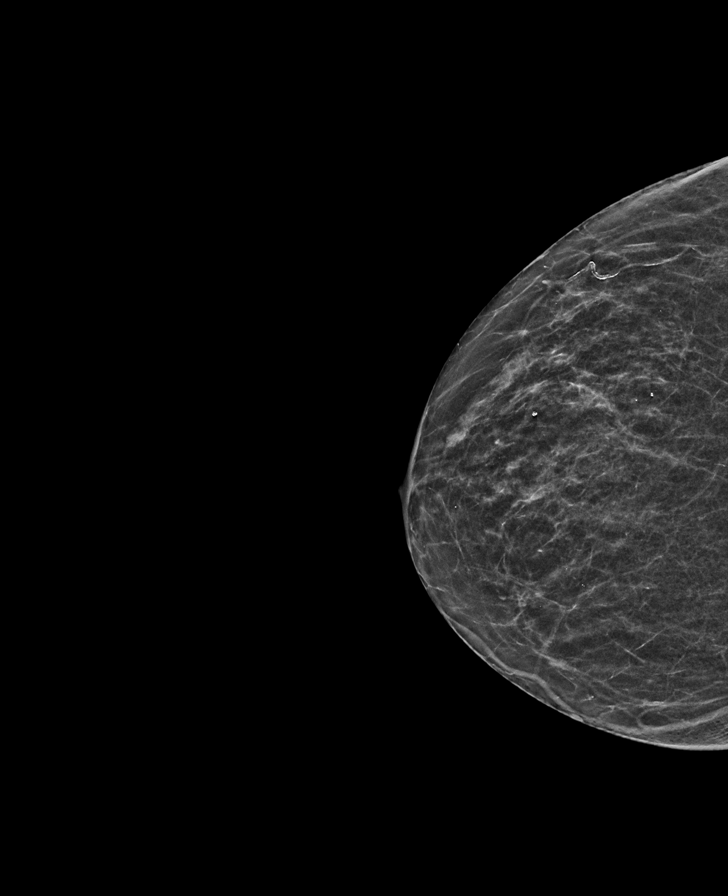

[R MLO tomo · tomo slice 25/50.0]
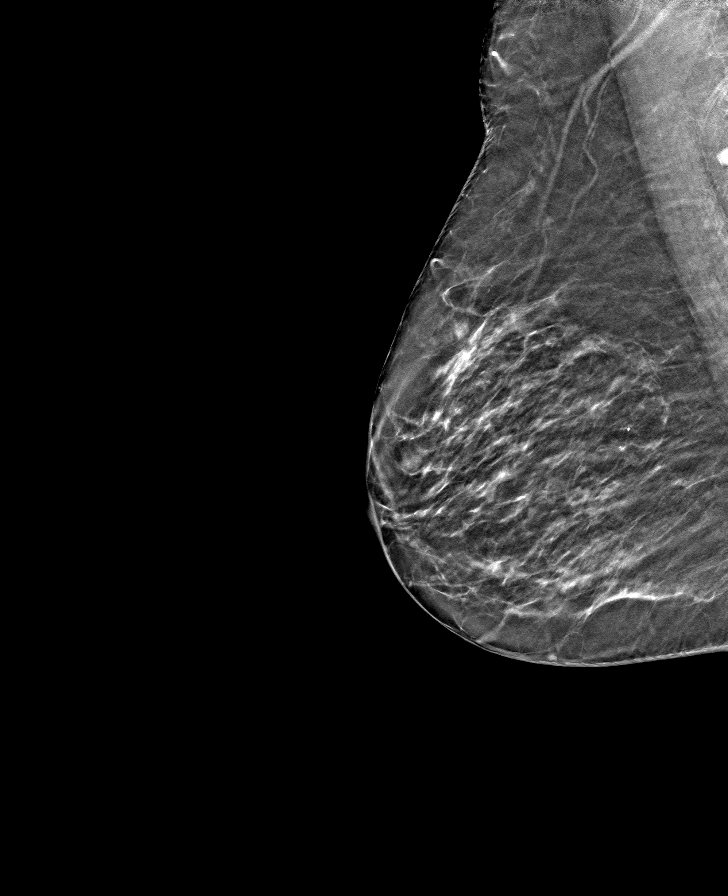

[L CC tomo · tomo slice 24/47.0]
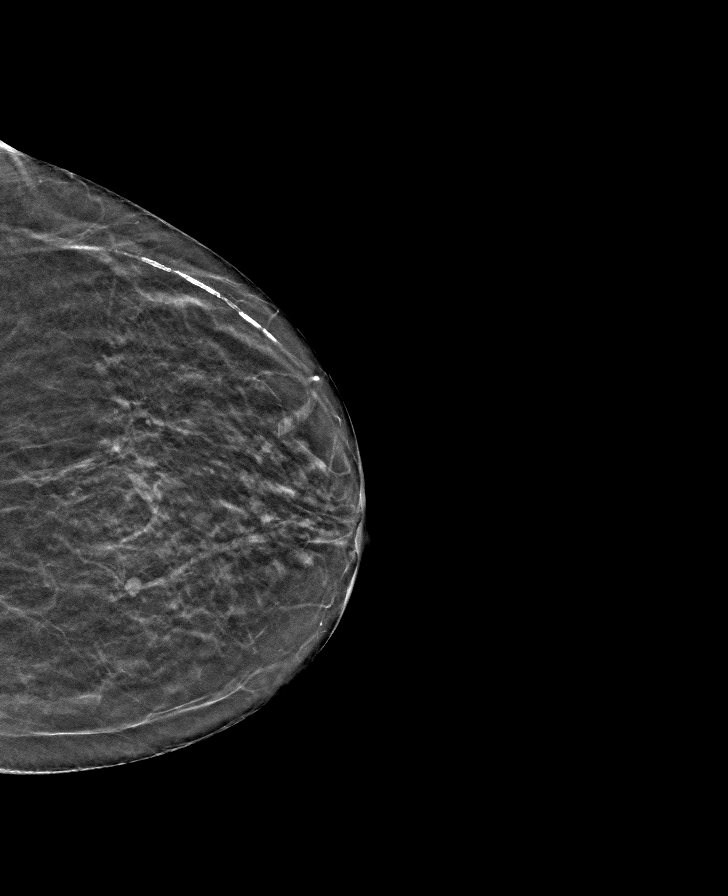

[L MLO tomo · tomo slice 25/48.0]
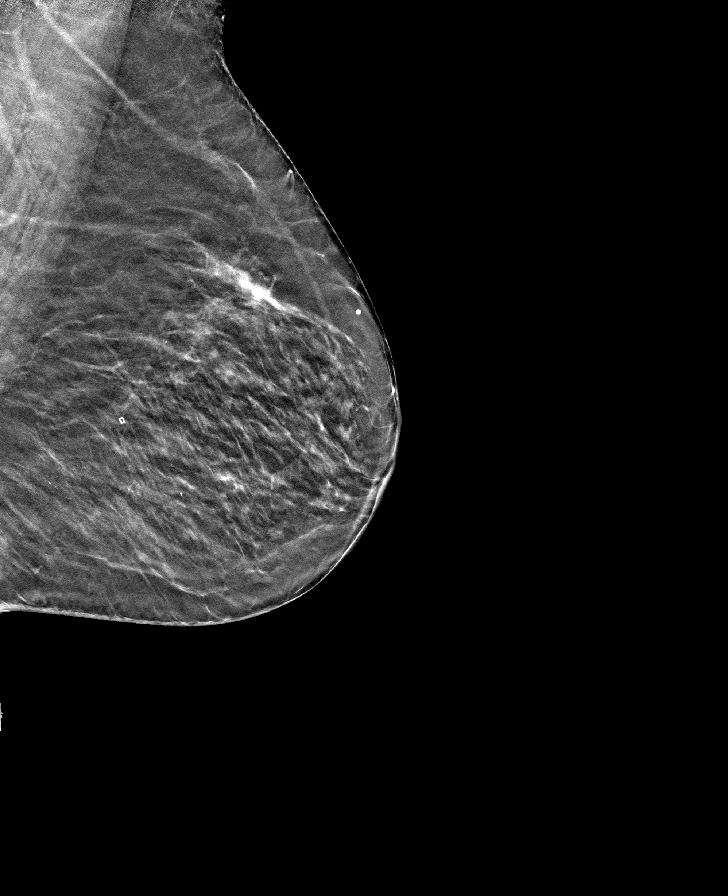

[R CC tomo · tomo slice 23/45.0]
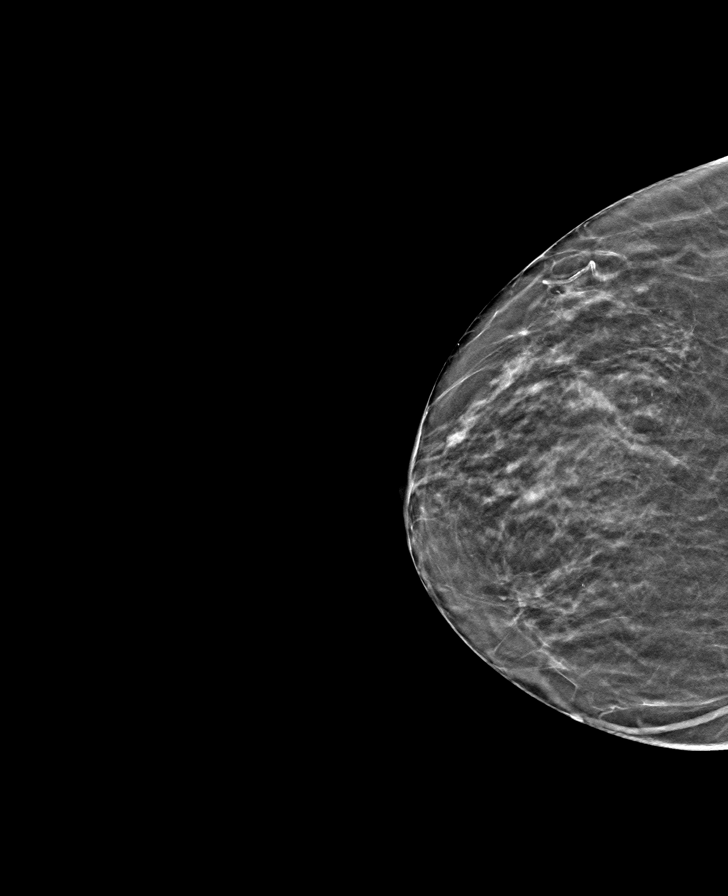

[8 of 24 positions shown; findings below may reference images not displayed]

ACR Breast Density Category b: There are scattered areas of
fibroglandular density.
FINDINGS: In the left breast, a possible mass warrants further evaluation. In
the right breast, no findings suspicious for malignancy.

Images were processed with CAD.
IMPRESSION: Further evaluation is suggested for possible mass in the left
breast.

RECOMMENDATION:
Diagnostic mammogram and possibly ultrasound of the left breast.
(Code:JC-2-SSL)

The patient will be contacted regarding the findings, and additional
imaging will be scheduled.

BI-RADS CATEGORY  0: Incomplete. Need additional imaging evaluation
and/or prior mammograms for comparison.

## 2021-01-29 ENCOUNTER — Ambulatory Visit
Admission: RE | Admit: 2021-01-29 | Discharge: 2021-01-29 | Disposition: A | Discharge status: Home / Self Care | Payer: Medicare PPO | Source: Ambulatory Visit | Attending: General Surgery | Admitting: General Surgery

## 2021-01-29 ENCOUNTER — Other Ambulatory Visit: Payer: Self-pay | Admitting: General Surgery

## 2021-01-29 ENCOUNTER — Other Ambulatory Visit: Payer: Self-pay

## 2021-01-29 DIAGNOSIS — Z853 Personal history of malignant neoplasm of breast: Secondary | ICD-10-CM | POA: Insufficient documentation

## 2021-02-21 ENCOUNTER — Other Ambulatory Visit: Payer: Self-pay

## 2021-02-21 ENCOUNTER — Ambulatory Visit: Payer: Medicare PPO | Admitting: Dermatology

## 2021-02-21 ENCOUNTER — Encounter: Payer: Self-pay | Admitting: Dermatology

## 2021-02-21 DIAGNOSIS — L719 Rosacea, unspecified: Secondary | ICD-10-CM | POA: Diagnosis not present

## 2021-02-21 DIAGNOSIS — L570 Actinic keratosis: Secondary | ICD-10-CM

## 2021-02-21 DIAGNOSIS — Z85820 Personal history of malignant melanoma of skin: Secondary | ICD-10-CM

## 2021-02-21 DIAGNOSIS — L820 Inflamed seborrheic keratosis: Secondary | ICD-10-CM | POA: Diagnosis not present

## 2021-02-21 DIAGNOSIS — Z1283 Encounter for screening for malignant neoplasm of skin: Secondary | ICD-10-CM

## 2021-02-21 DIAGNOSIS — D1801 Hemangioma of skin and subcutaneous tissue: Secondary | ICD-10-CM

## 2021-02-21 DIAGNOSIS — D229 Melanocytic nevi, unspecified: Secondary | ICD-10-CM

## 2021-02-21 DIAGNOSIS — L578 Other skin changes due to chronic exposure to nonionizing radiation: Secondary | ICD-10-CM | POA: Diagnosis not present

## 2021-02-21 DIAGNOSIS — L821 Other seborrheic keratosis: Secondary | ICD-10-CM

## 2021-02-21 DIAGNOSIS — Z86018 Personal history of other benign neoplasm: Secondary | ICD-10-CM

## 2021-02-21 DIAGNOSIS — L814 Other melanin hyperpigmentation: Secondary | ICD-10-CM

## 2021-02-21 DIAGNOSIS — B351 Tinea unguium: Secondary | ICD-10-CM

## 2021-02-21 MED ORDER — TAVABOROLE 5 % EX SOLN: 1.0000 "application " | Freq: Every day | CUTANEOUS | 11 refills | Status: AC

## 2021-02-21 MED ORDER — METRONIDAZOLE 0.75 % EX CREA: TOPICAL_CREAM | Freq: Every evening | CUTANEOUS | 11 refills | Status: AC

## 2021-02-21 MED ORDER — KETOCONAZOLE 2 % EX CREA: 1.0000 "application " | TOPICAL_CREAM | Freq: Every day | CUTANEOUS | 6 refills | Status: AC

## 2021-02-21 NOTE — Progress Notes (Signed)
Follow-Up Visit   Subjective  Amanda Mann is a 87 y.o. female who presents for the following: Total body skin exam (Hx of dysplastic nevus L lat elbow, hx of Melanoma L upper arm) and Rosacea (Face, metronidazole 0.75% cr qhs). The patient presents for Total-Body Skin Exam (TBSE) for skin cancer screening and mole check.The patient has spots, moles and lesions to be evaluated, some may be new or changing and the patient has concerns that these could be cancer.   The following portions of the chart were reviewed this encounter and updated as appropriate:   Tobacco  Allergies  Meds  Problems  Med Hx  Surg Hx  Fam Hx     Review of Systems:  No other skin or systemic complaints except as noted in HPI or Assessment and Plan.  Objective  Well appearing patient in no apparent distress; mood and affect are within normal limits.  A full examination was performed including scalp, head, eyes, ears, nose, lips, neck, chest, axillae, abdomen, back, buttocks, bilateral upper extremities, bilateral lower extremities, hands, feet, fingers, toes, fingernails, and toenails. All findings within normal limits unless otherwise noted below.  L upper arm Well healed scar with no evidence of recurrence, no lymphadenopathy.   Nose x 2 (2) Pink scaly macules   R ant waistline x 1, Total = 1 Erythematous keratotic or waxy stuck-on papule or plaque.   Right Foot - Anterior Toenail dystrophy  Head - Anterior (Face) Erythema face   Assessment & Plan   Lentigines - Scattered tan macules - Due to sun exposure - Benign-appearing, observe - Recommend daily broad spectrum sunscreen SPF 30+ to sun-exposed areas, reapply every 2 hours as needed. - Call for any changes  Seborrheic Keratoses - Stuck-on, waxy, tan-brown papules and/or plaques  - Benign-appearing - Discussed benign etiology and prognosis. - Observe - Call for any changes  Melanocytic Nevi - Tan-brown and/or pink-flesh-colored  symmetric macules and papules - Benign appearing on exam today - Observation - Call clinic for new or changing moles - Recommend daily use of broad spectrum spf 30+ sunscreen to sun-exposed areas.   Hemangiomas - Red papules - Discussed benign nature - Observe - Call for any changes  Actinic Damage - Chronic condition, secondary to cumulative UV/sun exposure - diffuse scaly erythematous macules with underlying dyspigmentation - Recommend daily broad spectrum sunscreen SPF 30+ to sun-exposed areas, reapply every 2 hours as needed.  - Staying in the shade or wearing long sleeves, sun glasses (UVA+UVB protection) and wide brim hats (4-inch brim around the entire circumference of the hat) are also recommended for sun protection.  - Call for new or changing lesions.  Skin cancer screening performed today.  History of melanoma L upper arm  1990 Clear. Observe for recurrence. Call clinic for new or changing lesions.  Recommend regular skin exams, daily broad-spectrum spf 30+ sunscreen use, and photoprotection.    AK (actinic keratosis) (2) Nose x 2  Destruction of lesion - Nose x 2 Complexity: simple   Destruction method: cryotherapy   Informed consent: discussed and consent obtained   Timeout:  patient name, date of birth, surgical site, and procedure verified Lesion destroyed using liquid nitrogen: Yes   Region frozen until ice ball extended beyond lesion: Yes   Outcome: patient tolerated procedure well with no complications   Post-procedure details: wound care instructions given    Inflamed seborrheic keratosis R ant waistline x 1, Total = 1  Destruction of lesion - R ant  waistline x 1, Total = 1 Complexity: simple   Destruction method: cryotherapy   Informed consent: discussed and consent obtained   Timeout:  patient name, date of birth, surgical site, and procedure verified Lesion destroyed using liquid nitrogen: Yes   Region frozen until ice ball extended beyond  lesion: Yes   Outcome: patient tolerated procedure well with no complications   Post-procedure details: wound care instructions given    Tinea unguium Right Foot - Anterior With Tinea Pedis Chronic and persistent. Start Kerydin sol qhs to toenails Start Ketoconazole cream qhs to feet  If Kerydin not covered will send in Penlac  ketoconazole (NIZORAL) 2 % cream - Right Foot - Anterior Apply 1 application topically daily. Qhs to feet and in between toes  Tavaborole (KERYDIN) 5 % SOLN - Right Foot - Anterior Apply 1 application topically at bedtime. Qhs to toenails  Rosacea Head - Anterior (Face) Better controlled on medication  Rosacea is a chronic progressive skin condition usually affecting the face of adults, causing redness and/or acne bumps. It is treatable but not curable. It sometimes affects the eyes (ocular rosacea) as well. It may respond to topical and/or systemic medication and can flare with stress, sun exposure, alcohol, exercise and some foods.  Daily application of broad spectrum spf 30+ sunscreen to face is recommended to reduce flares.  Cont Metronidazole cream 0.75% qhs Consider skin medicinals triple rosacea cream in the future.  metroNIDAZOLE (METROCREAM) 0.75 % cream - Head - Anterior (Face) Apply topically at bedtime. Qhs to face for rosacea  Skin cancer screening  History of Dysplastic Nevi - No evidence of recurrence today - Recommend regular full body skin exams - Recommend daily broad spectrum sunscreen SPF 30+ to sun-exposed areas, reapply every 2 hours as needed.  - Call if any new or changing lesions are noted between office visits  - L lat elbow  Return in about 1 year (around 02/21/2022) for TBSE, Hx of Melanoma, Hx of Dysplastic nevi, Hx of AKs.  I, Othelia Pulling, RMA, am acting as scribe for Sarina Ser, MD . Documentation: I have reviewed the above documentation for accuracy and completeness, and I agree with the above.  Sarina Ser,  MD

## 2021-02-21 NOTE — Patient Instructions (Addendum)
If You Need Anything After Your Visit  If you have any questions or concerns for your doctor, please call our main line at [phone] and press option 4 to reach your doctor's medical assistant. If no one answers, please leave a voicemail as directed and we will return your call as soon as possible. Messages left after 4 pm will be answered the following business day.   You may also send us a message via MyChart. We typically respond to MyChart messages within 1-2 business days.  For prescription refills, please ask your pharmacy to contact our office. Our fax number is [phone].  If you have an urgent issue when the clinic is closed that cannot wait until the next business day, you can page your doctor at the number below.    Please note that while we do our best to be available for urgent issues outside of office hours, we are not available 24/7.   If you have an urgent issue and are unable to reach us, you may choose to seek medical care at your doctor's office, retail clinic, urgent care center, or emergency room.  If you have a medical emergency, please immediately call 911 or go to the emergency department.  Pager Numbers  - Dr. Kowalski: [phone]  - Dr. Moye: [phone]  - Dr. Stewart: [phone]  In the event of inclement weather, please call our main line at [phone] for an update on the status of any delays or closures.  Dermatology Medication Tips: Please keep the boxes that topical medications come in in order to help keep track of the instructions about where and how to use these. Pharmacies typically print the medication instructions only on the boxes and not directly on the medication tubes.   If your medication is too expensive, please contact our office at [phone] option 4 or send us a message through MyChart.   We are unable to tell what your co-pay for medications will be in advance as this is different depending on your insurance coverage.  However, we may be able to find a substitute medication at lower cost or fill out paperwork to get insurance to cover a needed medication.   If a prior authorization is required to get your medication covered by your insurance company, please allow us 1-2 business days to complete this process.  Drug prices often vary depending on where the prescription is filled and some pharmacies may offer cheaper prices.  The website www.goodrx.com contains coupons for medications through different pharmacies. The prices here do not account for what the cost may be with help from insurance (it may be cheaper with your insurance), but the website can give you the price if you did not use any insurance.  - You can print the associated coupon and take it with your prescription to the pharmacy.  - You may also stop by our office during regular business hours and pick up a GoodRx coupon card.  - If you need your prescription sent electronically to a different pharmacy, notify our office through [place] MyChart or by phone at [phone] option 4.     Si Usted Necesita Algo Despus de Su Visita  Tambin puede enviarnos un mensaje a travs de MyChart. Por lo general respondemos a los mensajes de MyChart en el transcurso de 1 a 2 das hbiles.  Para renovar recetas, por favor pida a su farmacia que se ponga en contacto con nuestra oficina. Nuestro nmero de fax es el [phone].  Si tiene   un asunto urgente cuando la clnica est cerrada y que no puede esperar hasta el siguiente da hbil, puede llamar/localizar a su doctor(a) al nmero que aparece a continuacin.   Por favor, tenga en cuenta que aunque hacemos todo lo posible para estar disponibles para asuntos urgentes fuera del horario de oficina, no estamos disponibles las 24 horas del da, los 7 das de la semana.   Si tiene un problema urgente y no puede comunicarse con nosotros, puede optar por buscar atencin mdica  en el consultorio de su  doctor(a), en una clnica privada, en un centro de atencin urgente o en una sala de emergencias.  Si tiene una emergencia mdica, por favor llame inmediatamente al 911 o vaya a la sala de emergencias.  Nmeros de bper  - Dr. Kowalski: [phone]  - Dra. Moye: [phone]  - Dra. Stewart: [phone]  En caso de inclemencias del tiempo, por favor llame a nuestra lnea principal al [phone] para una actualizacin sobre el estado de cualquier retraso o cierre.  Consejos para la medicacin en dermatologa: Por favor, guarde las cajas en las que vienen los medicamentos de uso tpico para ayudarle a seguir las instrucciones sobre dnde y cmo usarlos. Las farmacias generalmente imprimen las instrucciones del medicamento slo en las cajas y no directamente en los tubos del medicamento.   Si su medicamento es muy caro, por favor, pngase en contacto con nuestra oficina llamando al [phone] y presione la opcin 4 o envenos un mensaje a travs de MyChart.   No podemos decirle cul ser su copago por los medicamentos por adelantado ya que esto es diferente dependiendo de la cobertura de su seguro. Sin embargo, es posible que podamos encontrar un medicamento sustituto a menor costo o llenar un formulario para que el seguro cubra el medicamento que se considera necesario.   Si se requiere una autorizacin previa para que su compaa de seguros cubra su medicamento, por favor permtanos de 1 a 2 das hbiles para completar este proceso.  Los precios de los medicamentos varan con frecuencia dependiendo del lugar de dnde se surte la receta y alguna farmacias pueden ofrecer precios ms baratos.  El sitio web www.goodrx.com tiene cupones para medicamentos de diferentes farmacias. Los precios aqu no tienen en cuenta lo que podra costar con la ayuda del seguro (puede ser ms barato con su seguro), pero el sitio web puede darle el precio si no utiliz ningn seguro.  - Puede imprimir el cupn  correspondiente y llevarlo con su receta a la farmacia.  - Tambin puede pasar por nuestra oficina durante el horario de atencin regular y recoger una tarjeta de cupones de GoodRx.  - Si necesita que su receta se enve electrnicamente a una farmacia diferente, informe a nuestra oficina a travs de MyChart de [place] o por telfono llamando al [phone] y presione la opcin 4.   Cryotherapy Aftercare  Wash gently with soap and water everyday.   Apply Vaseline and Band-Aid daily until healed.  

## 2021-02-28 ENCOUNTER — Encounter: Payer: Self-pay | Admitting: Dermatology

## 2021-07-15 ENCOUNTER — Emergency Department: Payer: Medicare PPO

## 2021-07-15 ENCOUNTER — Other Ambulatory Visit: Payer: Self-pay

## 2021-07-15 ENCOUNTER — Emergency Department
Admission: EM | Admit: 2021-07-15 | Discharge: 2021-07-15 | Disposition: A | Discharge status: Home / Self Care | Payer: Medicare PPO | Attending: Emergency Medicine | Admitting: Emergency Medicine

## 2021-07-15 DIAGNOSIS — E039 Hypothyroidism, unspecified: Secondary | ICD-10-CM | POA: Diagnosis not present

## 2021-07-15 DIAGNOSIS — Z79899 Other long term (current) drug therapy: Secondary | ICD-10-CM | POA: Diagnosis not present

## 2021-07-15 DIAGNOSIS — Z853 Personal history of malignant neoplasm of breast: Secondary | ICD-10-CM | POA: Diagnosis not present

## 2021-07-15 DIAGNOSIS — R42 Dizziness and giddiness: Secondary | ICD-10-CM

## 2021-07-15 DIAGNOSIS — H5509 Other forms of nystagmus: Secondary | ICD-10-CM | POA: Insufficient documentation

## 2021-07-15 DIAGNOSIS — R2681 Unsteadiness on feet: Secondary | ICD-10-CM | POA: Diagnosis not present

## 2021-07-15 DIAGNOSIS — Z85820 Personal history of malignant melanoma of skin: Secondary | ICD-10-CM | POA: Insufficient documentation

## 2021-07-15 DIAGNOSIS — I672 Cerebral atherosclerosis: Secondary | ICD-10-CM | POA: Diagnosis not present

## 2021-07-15 DIAGNOSIS — I10 Essential (primary) hypertension: Secondary | ICD-10-CM | POA: Insufficient documentation

## 2021-07-15 DIAGNOSIS — R930 Abnormal findings on diagnostic imaging of skull and head, not elsewhere classified: Secondary | ICD-10-CM | POA: Diagnosis not present

## 2021-07-15 LAB — BASIC METABOLIC PANEL
Anion gap: 6 (ref 5–15)
BUN: 19 mg/dL (ref 8–23)
CO2: 28 mmol/L (ref 22–32)
Calcium: 9.5 mg/dL (ref 8.9–10.3)
Chloride: 107 mmol/L (ref 98–111)
Creatinine, Ser: 0.83 mg/dL (ref 0.44–1.00)
GFR, Estimated: >60 mL/min (ref >60)
Glucose, Bld: 110 mg/dL — ABNORMAL HIGH (ref 70–99)
Potassium: 3.7 mmol/L (ref 3.5–5.1)
Sodium: 141 mmol/L (ref 135–145)

## 2021-07-15 LAB — CBC
HCT: 47.2 % — ABNORMAL HIGH (ref 36.0–46.0)
Hemoglobin: 15.6 g/dL — ABNORMAL HIGH (ref 12.0–15.0)
MCH: 30.8 pg (ref 26.0–34.0)
MCHC: 33.1 g/dL (ref 30.0–36.0)
MCV: 93.3 fL (ref 80.0–100.0)
Platelets: 239 10*3/uL (ref 150–400)
RBC: 5.06 MIL/uL (ref 3.87–5.11)
RDW: 12 % (ref 11.5–15.5)
WBC: 6.7 10*3/uL (ref 4.0–10.5)
nRBC: 0 % (ref 0.0–0.2)

## 2021-07-15 LAB — URINALYSIS, ROUTINE W REFLEX MICROSCOPIC
Bilirubin Urine: NEGATIVE
Glucose, UA: NEGATIVE mg/dL
Hgb urine dipstick: NEGATIVE
Ketones, ur: NEGATIVE mg/dL
Leukocytes,Ua: NEGATIVE
Nitrite: NEGATIVE
Protein, ur: NEGATIVE mg/dL
Specific Gravity, Urine: 1.011 (ref 1.005–1.030)
pH: 7 (ref 5.0–8.0)

## 2021-07-15 MED ORDER — ATENOLOL 25 MG PO TABS
25.0000 mg | ORAL_TABLET | Freq: Once | ORAL | Status: AC
  Administered 2021-07-15: 25 mg via ORAL
  Filled 2021-07-15: qty 1

## 2021-07-15 MED ORDER — GADOBUTROL 1 MMOL/ML IV SOLN
6.0000 mL | Freq: Once | INTRAVENOUS | Status: AC | PRN
  Administered 2021-07-15: 6 mL via INTRAVENOUS
  Filled 2021-07-15: qty 6

## 2021-07-15 MED ORDER — MECLIZINE HCL 25 MG PO TABS
25.0000 mg | ORAL_TABLET | Freq: Once | ORAL | Status: AC
  Administered 2021-07-15: 25 mg via ORAL
  Filled 2021-07-15: qty 1

## 2021-07-15 NOTE — ED Notes (Signed)
Pt has a states she has a history of vertigo. Pt states she feels like she's going to pass out and gets worse when she is moving. Pt states that her vertigo feels different than her vertigo from the past.  ?

## 2021-07-15 NOTE — ED Provider Notes (Signed)
Dr. Marland Kitchen ?[facility] ?Provider Note ? ? ? Event Date/Time  ? First MD Initiated Contact with Patient 07/15/21 0820   ?  (approximate) ? ? ?History  ? ?Weakness ? ? ?HPI ? ?Amanda Mann is a 88 y.o. female with past medical history of hypertension on atenolol, breast cancer, hypothyroidism presents with lightheadedness/dizziness.  Patient tells me that she saw Dr. Tami Ribas with ENT about a week ago was treated for with Epley maneuver for vertigo.  Has not been on any medication for this.  Since that time has had intermittent vertigo.  She describes that prior feeling as a spinning-like sensation.  This morning when she woke up she somewhat of a different sensation.  Has trouble fully describing this but describes it more as lightheadedness but it is worse with movement also feels unsteady walking.  With the vertigo before she was able to walk but this morning was unable to.  When she does not move her head or eyes she is asymptomatic.  Denies diplopia dysarthria numbness tingling weakness or headache.  Typically blood pressure is well controlled but her husband notes that while she is in the hospital it is elevated.  Did not take her atenolol today.  She denies chest pain or shortness of breath. ?  ? ?Past Medical History:  ?Diagnosis Date  ? Actinic keratosis   ? Arthritis   ? Breast cancer ([place]) 02/2018  ? left breast  ? Cancer ([place])   ? melanoma  ? Dysrhythmia   ? chronic tachycardia  ? Hoarseness of voice   ? BIL VC BOWING BY DR Tami Ribas 2017. NOTE ON CHART  ? Hx of dysplastic nevus 01/21/2018  ? L lateral elbow, severe atypia. Excised 04/06/2018.  ? Hypertension   ? Hypothyroidism   ? Irritable bowel syndrome   ? Melanoma ([place]) ~1990  ? L upper arm  ? Thyroid disease   ? ? ?Patient Active Problem List  ? Diagnosis Date Noted  ? Goals of care, counseling/discussion 03/10/2018  ? Invasive carcinoma of breast ([place]) 03/09/2018  ? ? ? ?Physical Exam  ?Triage Vital Signs: ?ED Triage Vitals  ?Enc  Vitals Group  ?   BP 07/15/21 0818 (!) 225/68  ?   Pulse Rate 07/15/21 0818 78  ?   Resp 07/15/21 0818 18  ?   Temp 07/15/21 0818 98 ?F (36.7 ?C)  ?   Temp src --   ?   SpO2 07/15/21 0818 97 %  ?   Weight --   ?   Height --   ?   Head Circumference --   ?   Peak Flow --   ?   Pain Score 07/15/21 0815 0  ?   Pain Loc --   ?   Pain Edu? --   ?   Excl. in [place]? --   ? ? ?Most recent vital signs: ?Vitals:  ? 07/15/21 1430 07/15/21 1445  ?BP: (!) 139/41 (!) 179/61  ?Pulse: 78 70  ?Resp: 15 14  ?Temp:    ?SpO2: 97% 98%  ? ? ? ?General: Awake, no distress.  ?CV:  Good peripheral perfusion.  ?Resp:  Normal effort.  ?Abd:  No distention.  ?Neuro:             Awake, Alert, Oriented x 3  ?Other:  Aox3, nml speech  ?PERRL, EOMI, face symmetric, nml tongue movement  ?+ R beating horizontal nystagmus  ?No vertical nystagmus  ?5/5 strength in the BL upper  and lower extremities  ?Sensation grossly intact in the BL upper and lower extremities  ?Finger-nose-finger intact BL ?Pt walks with steady gate, no ataxia ? ? ? ?ED Results / Procedures / Treatments  ?Labs ?(all labs ordered are listed, but only abnormal results are displayed) ?Labs Reviewed  ?BASIC METABOLIC PANEL - Abnormal; Notable for the following components:  ?    Result Value  ? Glucose, Bld 110 (*)   ? All other components within normal limits  ?CBC - Abnormal; Notable for the following components:  ? Hemoglobin 15.6 (*)   ? HCT 47.2 (*)   ? All other components within normal limits  ?URINALYSIS, ROUTINE W REFLEX MICROSCOPIC - Abnormal; Notable for the following components:  ? Color, Urine YELLOW (*)   ? APPearance CLEAR (*)   ? All other components within normal limits  ?CBG MONITORING, ED  ? ? ? ?EKG ? ?EKG interpretation performed by myself: NSR, nml axis, nml intervals, no acute ischemic changes ? ? ? ?RADIOLOGY ?I reviewed the patient's CT which shows possible right pontine infarct ? ? ?PROCEDURES: ? ?Critical Care performed: No ? ?Procedures ? ?The patient is on the  cardiac monitor to evaluate for evidence of arrhythmia and/or significant heart rate changes. ? ? ?MEDICATIONS ORDERED IN ED: ?Medications  ?atenolol (TENORMIN) tablet 25 mg (25 mg Oral Given 07/15/21 0858)  ?meclizine (ANTIVERT) tablet 25 mg (25 mg Oral Given 07/15/21 0858)  ?gadobutrol (GADAVIST) 1 MMOL/ML injection 6 mL (6 mLs Intravenous Contrast Given 07/15/21 1359)  ? ? ? ?IMPRESSION / MDM / ASSESSMENT AND PLAN / ED COURSE  ?I reviewed the triage vital signs and the nursing notes. ?             ?               ? ?Differential diagnosis includes, but is not limited to, peripheral vertigo, central vertigo from CVA, anemia, electrolyte abnormality, less likely orthostasis, vasovagal syncope ? ?Patient is a 88 year old female presents with lightheadedness/dizziness.  Has been treated recently by ENT for vertigo, with Epley maneuver in the office per patient.  Has had some intermittent what she describes as spinning feeling since then.  But today when she woke up sensation was somewhat different patient has difficulty describing it but endorses feeling like she is going to pass out was not able to walk because she thought that she was going to fall.  Typically with the vertigo she had before she has not had difficulty ambulating.  She has no other neurologic complaints.  She is asymptomatic with keeping her head still.  5 movement and head movement makes her symptoms worse.  On exam she has some horizontal right beating nystagmus but no other concerning nystagmus, normal finger-nose-finger and no other abnormal neurologic findings.  I saw her ambulate and she had no ataxia.  Patient is notably hypertensive here BP 226/62.  Looked back at previous notes and she is typically well controlled.  Husband says she does have some whitecoat hypertension.  Does not have any other symptoms of hypertensive emergency including no headache chest pain shortness of breath etc.  Ultimately think my suspicion for a posterior CVA is low  given her neurologic exam.  Think peripheral vertigo is more likely.  Will get screening labs including CBC and BMP.  Potential that her symptoms orthostatic in nature.  We will obtain a CT head given her age and high blood pressure rule out bleed.  We will try meclizine and give  her her home atenolol which she did not take this morning and reassess. ?  ? ?CT head obtained and radiology called me saying that there is some concern for possible low-attenuation in the right pons which could indicate an acute infarct.  Given this is a wake-up stroke and patient has minimal deficits on exam no indication for stroke alert at this time.  Repeat blood pressure is 198/62, will allow for permissive hypertension up to blood pressure 220/120.  We will obtain MRI of the brain and MRA. ? ?MRI does not show any acute infarct.  MRA does not show any LVO however there are multiple intracranial stenoses including severe stenosis of the M1.  She is on aspirin 81 daily.  Discussed with neurology about whether patient should be on dual antiplatelet therapy given these findings.  Lindzen notes that because she has not had a stroke risk benefit ratio does not support to antiplatelet therapy she should discontinue the aspirin.  Discussed findings with the patient.  Given she is able to ambulate has no stroke and no other etiology identified for her dizziness I think that this is most likely peripheral vertigo and that she can be discharged with ENT follow-up. ?FINAL CLINICAL IMPRESSION(S) / ED DIAGNOSES  ? ?Final diagnoses:  ?Dizziness  ?Intracranial atherosclerosis  ? ? ? ?Rx / DC Orders  ? ?ED Discharge Orders   ? ? None  ? ?  ? ? ? ?Note:  This document was prepared using Dragon voice recognition software and may include unintentional dictation errors. ?  ?Rada Hay, MD ?07/15/21 1600 ? ?

## 2021-07-15 NOTE — ED Triage Notes (Signed)
Pt in via EMS from [facility] living with c/o vertigo for 3 weeks worsening when she woke up this am. Pt was not able to get up out of bed this am.  ? ?BP 210/76, 2nd 196/86, HR 80, 99% RA ?

## 2021-07-15 NOTE — Discharge Instructions (Addendum)
Your MRI did not show any evidence of stroke. You can continue to follow-up with your primary care provider and Dr. Hayes Ludwig with ENT.  ?

## 2022-01-01 ENCOUNTER — Other Ambulatory Visit: Payer: Self-pay | Admitting: Family Medicine

## 2022-01-01 ENCOUNTER — Other Ambulatory Visit: Payer: Self-pay | Admitting: Emergency Medicine

## 2022-01-01 ENCOUNTER — Other Ambulatory Visit: Payer: Self-pay | Admitting: General Surgery

## 2022-01-01 DIAGNOSIS — N63 Unspecified lump in unspecified breast: Secondary | ICD-10-CM

## 2022-01-01 DIAGNOSIS — Z1231 Encounter for screening mammogram for malignant neoplasm of breast: Secondary | ICD-10-CM

## 2022-02-21 ENCOUNTER — Ambulatory Visit: Payer: Medicare PPO | Admitting: Dermatology

## 2022-02-21 DIAGNOSIS — Z1283 Encounter for screening for malignant neoplasm of skin: Secondary | ICD-10-CM | POA: Diagnosis not present

## 2022-02-21 DIAGNOSIS — L814 Other melanin hyperpigmentation: Secondary | ICD-10-CM | POA: Diagnosis not present

## 2022-02-21 DIAGNOSIS — Z79899 Other long term (current) drug therapy: Secondary | ICD-10-CM

## 2022-02-21 DIAGNOSIS — Z85820 Personal history of malignant melanoma of skin: Secondary | ICD-10-CM

## 2022-02-21 DIAGNOSIS — L578 Other skin changes due to chronic exposure to nonionizing radiation: Secondary | ICD-10-CM

## 2022-02-21 DIAGNOSIS — B353 Tinea pedis: Secondary | ICD-10-CM | POA: Diagnosis not present

## 2022-02-21 DIAGNOSIS — D229 Melanocytic nevi, unspecified: Secondary | ICD-10-CM

## 2022-02-21 DIAGNOSIS — L821 Other seborrheic keratosis: Secondary | ICD-10-CM

## 2022-02-21 MED ORDER — TAVABOROLE 5 % EX SOLN: 1.0000 "application " | Freq: Every day | CUTANEOUS | 11 refills | Status: AC

## 2022-02-21 MED ORDER — KETOCONAZOLE 2 % EX CREA: 1.0000 | TOPICAL_CREAM | Freq: Every day | CUTANEOUS | 11 refills | Status: AC

## 2022-02-21 NOTE — Patient Instructions (Signed)
Recommend silicone toe separator to relieve pressure of corn.     Due to recent changes in healthcare laws, you may see results of your pathology and/or laboratory studies on MyChart before the doctors have had a chance to review them. We understand that in some cases there may be results that are confusing or concerning to you. Please understand that not all results are received at the same time and often the doctors may need to interpret multiple results in order to provide you with the best plan of care or course of treatment. Therefore, we ask that you please give [place] 2 business days to thoroughly review all your results before contacting the office for clarification. Should we see a critical lab result, you will be contacted sooner.   If You Need Anything After Your Visit  If you have any questions or concerns for your doctor, please call our main line at [phone] and press option 4 to reach your doctor's medical assistant. If no one answers, please leave a voicemail as directed and we will return your call as soon as possible. Messages left after 4 pm will be answered the following business day.   You may also send [place] a message via [place]. We typically respond to MyChart messages within 1-2 business days.  For prescription refills, please ask your pharmacy to contact our office. Our fax number is [phone].  If you have an urgent issue when the clinic is closed that cannot wait until the next business day, you can page your doctor at the number below.    Please note that while we do our best to be available for urgent issues outside of office hours, we are not available 24/7.   If you have an urgent issue and are unable to reach [place], you may choose to seek medical care at your doctor's office, retail clinic, urgent care center, or emergency room.  If you have a medical emergency, please immediately call 911 or go to the emergency department.  Pager Numbers  - Dr. Nehemiah Massed:  [phone]  - Dr. Laurence Ferrari: [phone]  - Dr. Nicole Kindred: [phone]  In the event of inclement weather, please call our main line at [phone] for an update on the status of any delays or closures.  Dermatology Medication Tips: Please keep the boxes that topical medications come in in order to help keep track of the instructions about where and how to use these. Pharmacies typically print the medication instructions only on the boxes and not directly on the medication tubes.   If your medication is too expensive, please contact our office at [phone] option 4 or send [place] a message through [place].   We are unable to tell what your co-pay for medications will be in advance as this is different depending on your insurance coverage. However, we may be able to find a substitute medication at lower cost or fill out paperwork to get insurance to cover a needed medication.   If a prior authorization is required to get your medication covered by your insurance company, please allow [place] 1-2 business days to complete this process.  Drug prices often vary depending on where the prescription is filled and some pharmacies may offer cheaper prices.  The website www.goodrx.com contains coupons for medications through different pharmacies. The prices here do not account for what the cost may be with help from insurance (it may be cheaper with your insurance), but the website can give you the price if you did not use any  insurance.  - You can print the associated coupon and take it with your prescription to the pharmacy.  - You may also stop by our office during regular business hours and pick up a GoodRx coupon card.  - If you need your prescription sent electronically to a different pharmacy, notify our office through [facility] or by phone at [phone] option 4.     Si Usted Necesita Algo Despus de Su Visita  Tambin puede enviarnos un mensaje a travs de Pharmacist, community. Por lo general  respondemos a los mensajes de MyChart en el transcurso de 1 a 2 das hbiles.  Para renovar recetas, por favor pida a su farmacia que se ponga en contacto con nuestra oficina. Harland Dingwall de fax es [place] [phone].  Si tiene un asunto urgente cuando la clnica est cerrada y que no puede esperar hasta el siguiente da hbil, puede llamar/localizar a su doctor(a) al nmero que aparece a continuacin.   Por favor, tenga en cuenta que aunque hacemos todo lo posible para estar disponibles para asuntos urgentes fuera del horario de [place], no estamos disponibles las 24 horas del da, los 7 das de la [place].   Si tiene un problema urgente y no puede comunicarse con nosotros, puede optar por buscar atencin mdica  en el consultorio de su doctor(a), en una clnica privada, en un centro de atencin urgente o en una sala de emergencias.  Si tiene Engineering geologist, por favor llame inmediatamente al 911 o vaya a la sala de emergencias.  Nmeros de bper  - Dr. Nehemiah Massed: [phone]  - Dra. Moye: [phone]  - Dra. Nicole Kindred: [phone]  En caso de inclemencias del [place], por favor llame a Johnsie Kindred principal al [phone] para una actualizacin sobre el [place] de cualquier retraso o cierre.  Consejos para la medicacin en dermatologa: Por favor, guarde las cajas en las que vienen los medicamentos de uso tpico para ayudarle a seguir las instrucciones sobre dnde y cmo usarlos. Las farmacias generalmente imprimen las instrucciones del medicamento slo en las cajas y no directamente en los tubos del [place].   Si su medicamento es muy caro, por favor, pngase en contacto con Zigmund Daniel llamando al [phone] y presione la opcin 4 o envenos un mensaje a travs de Pharmacist, community.   No podemos decirle cul ser su copago por los medicamentos por adelantado ya que esto es diferente dependiendo de la cobertura de su seguro. Sin embargo, es posible que podamos encontrar un  medicamento sustituto a Electrical engineer un formulario para que el seguro cubra el medicamento que se considera necesario.   Si se requiere una autorizacin previa para que su compaa de seguros [place] su medicamento, por favor permtanos de 1 a 2 das hbiles para completar este proceso.  Los precios de los medicamentos varan con frecuencia dependiendo del Environmental consultant de dnde se surte la receta y alguna farmacias pueden ofrecer precios ms baratos.  El sitio web www.goodrx.com tiene cupones para medicamentos de Airline pilot. Los precios aqu no tienen en cuenta lo que podra costar con la ayuda del seguro (puede ser ms barato con su seguro), pero el sitio web puede darle el precio si no utiliz Research scientist (physical sciences).  - Puede imprimir el cupn correspondiente y llevarlo con su receta a la farmacia.  - Tambin puede pasar por nuestra oficina durante el horario de atencin regular y Charity fundraiser una tarjeta de cupones de GoodRx.  - Si necesita que su receta se enve electrnicamente a Ardelia Mems  farmacia diferente, informe a nuestra oficina a travs de MyChart de [place] o por telfono llamando al [phone] y presione la opcin 4.

## 2022-02-21 NOTE — Progress Notes (Signed)
Follow-Up Visit   Subjective  Amanda Mann is a 88 y.o. female who presents for the following: Annual Exam (History of Melanoma and dysplastic nevus - The patient presents for Total-Body Skin Exam (TBSE) for skin cancer screening and mole check.  The patient has spots, moles and lesions to be evaluated, some may be new or changing and the patient has concerns that these could be cancer./).  The following portions of the chart were reviewed this encounter and updated as appropriate:   Tobacco  Allergies  Meds  Problems  Med Hx  Surg Hx  Fam Hx     Review of Systems:  No other skin or systemic complaints except as noted in HPI or Assessment and Plan.  Objective  Well appearing patient in no apparent distress; mood and affect are within normal limits.  A full examination was performed including scalp, head, eyes, ears, nose, lips, neck, chest, axillae, abdomen, back, buttocks, bilateral upper extremities, bilateral lower extremities, hands, feet, fingers, toes, fingernails, and toenails. All findings within normal limits unless otherwise noted below.  Left Foot - Anterior Toenail dystrophy with scale of feet   Assessment & Plan   History of Melanoma - No evidence of recurrence today - No lymphadenopathy - Recommend regular full body skin exams - Recommend daily broad spectrum sunscreen SPF 30+ to sun-exposed areas, reapply every 2 hours as needed.  - Call if any new or changing lesions are noted between office visits  History of Dysplastic Nevi - No evidence of recurrence today - Recommend regular full body skin exams - Recommend daily broad spectrum sunscreen SPF 30+ to sun-exposed areas, reapply every 2 hours as needed.  - Call if any new or changing lesions are noted between office visits  Lentigines - Scattered tan macules - Due to sun exposure - Benign-appearing, observe - Recommend daily broad spectrum sunscreen SPF 30+ to sun-exposed areas, reapply every 2 hours  as needed. - Call for any changes  Seborrheic Keratoses - Stuck-on, waxy, tan-brown papules and/or plaques  - Benign-appearing - Discussed benign etiology and prognosis. - Observe - Call for any changes  Melanocytic Nevi - Tan-brown and/or pink-flesh-colored symmetric macules and papules - Benign appearing on exam today - Observation - Call clinic for new or changing moles - Recommend daily use of broad spectrum spf 30+ sunscreen to sun-exposed areas.   Hemangiomas - Red papules - Discussed benign nature - Observe - Call for any changes  Actinic Damage - Chronic condition, secondary to cumulative UV/sun exposure - diffuse scaly erythematous macules with underlying dyspigmentation - Recommend daily broad spectrum sunscreen SPF 30+ to sun-exposed areas, reapply every 2 hours as needed.  - Staying in the shade or wearing long sleeves, sun glasses (UVA+UVB protection) and wide brim hats (4-inch brim around the entire circumference of the hat) are also recommended for sun protection.  - Call for new or changing lesions.  Skin cancer screening performed today.  Tinea pedis of both feet Left Foot - Anterior Tinea Pedis and Unguium - Chronic and persistent condition with duration or expected duration over one year. Condition is symptomatic / bothersome to patient. Not to goal.  C[place]ford Mon solution qhs, Ketoconazole 2% cream qhs  Tavaborole (KERYDIN) 5 % SOLN - Left Foot - Anterior Apply 1 application  topically at bedtime. To toenails ketoconazole (NIZORAL) 2 % cream - Left Foot - Anterior Apply 1 Application topically at bedtime. To feet  Return in about 1 year (around 02/22/2023) for TBSE.  I,  Ashok Cordia, CMA, am acting as scribe for Sarina Ser, MD . Documentation: I have reviewed the above documentation for accuracy and completeness, and I agree with the above.  Sarina Ser, MD

## 2022-03-09 ENCOUNTER — Encounter: Payer: Self-pay | Admitting: Dermatology

## 2022-04-18 ENCOUNTER — Other Ambulatory Visit: Payer: Self-pay | Admitting: Dermatology

## 2023-02-06 ENCOUNTER — Ambulatory Visit: Payer: Medicare PPO | Admitting: Dermatology

## 2023-02-06 ENCOUNTER — Encounter: Payer: Self-pay | Admitting: Dermatology

## 2023-02-06 DIAGNOSIS — L578 Other skin changes due to chronic exposure to nonionizing radiation: Secondary | ICD-10-CM | POA: Diagnosis not present

## 2023-02-06 DIAGNOSIS — Z86018 Personal history of other benign neoplasm: Secondary | ICD-10-CM

## 2023-02-06 DIAGNOSIS — Z79899 Other long term (current) drug therapy: Secondary | ICD-10-CM

## 2023-02-06 DIAGNOSIS — Z1283 Encounter for screening for malignant neoplasm of skin: Secondary | ICD-10-CM | POA: Diagnosis not present

## 2023-02-06 DIAGNOSIS — L814 Other melanin hyperpigmentation: Secondary | ICD-10-CM | POA: Diagnosis not present

## 2023-02-06 DIAGNOSIS — B351 Tinea unguium: Secondary | ICD-10-CM

## 2023-02-06 DIAGNOSIS — Z85820 Personal history of malignant melanoma of skin: Secondary | ICD-10-CM

## 2023-02-06 DIAGNOSIS — D229 Melanocytic nevi, unspecified: Secondary | ICD-10-CM | POA: Diagnosis not present

## 2023-02-06 DIAGNOSIS — L719 Rosacea, unspecified: Secondary | ICD-10-CM

## 2023-02-06 DIAGNOSIS — Z7189 Other specified counseling: Secondary | ICD-10-CM

## 2023-02-06 DIAGNOSIS — B353 Tinea pedis: Secondary | ICD-10-CM

## 2023-02-06 MED ORDER — METRONIDAZOLE 0.75 % EX GEL: 1.0000 | Freq: Every day | CUTANEOUS | 5 refills | Status: AC

## 2023-02-06 NOTE — Patient Instructions (Signed)
Recommend daily Claritin as needed to help with itch.   Melanoma ABCDEs  Melanoma is the most dangerous type of skin cancer, and is the leading cause of death from skin disease.  You are more likely to develop melanoma if you: Have light-colored skin, light-colored eyes, or red or blond hair Spend a lot of time in the sun Tan regularly, either outdoors or in a tanning bed Have had blistering sunburns, especially during childhood Have a close family member who has had a melanoma Have atypical moles or large birthmarks  Early detection of melanoma is key since treatment is typically straightforward and cure rates are extremely high if we catch it early.   The first sign of melanoma is often a change in a mole or a new dark spot.  The ABCDE system is a way of remembering the signs of melanoma.  A for asymmetry:  The two halves do not match. B for border:  The edges of the growth are irregular. C for color:  A mixture of colors are present instead of an even brown color. D for diameter:  Melanomas are usually (but not always) greater than 6mm - the size of a pencil eraser. E for evolution:  The spot keeps changing in size, shape, and color.  Please check your skin once per month between visits. You can use a small mirror in front and a large mirror behind you to keep an eye on the back side or your body.   If you see any new or changing lesions before your next follow-up, please call to schedule a visit.  Please continue daily skin protection including broad spectrum sunscreen SPF 30+ to sun-exposed areas, reapplying every 2 hours as needed when you're outdoors.    Due to recent changes in healthcare laws, you may see results of your pathology and/or laboratory studies on MyChart before the doctors have had a chance to review them. We understand that in some cases there may be results that are confusing or concerning to you. Please understand that not all results are received at the same time  and often the doctors may need to interpret multiple results in order to provide you with the best plan of care or course of treatment. Therefore, we ask that you please give [place] 2 business days to thoroughly review all your results before contacting the office for clarification. Should we see a critical lab result, you will be contacted sooner.   If You Need Anything After Your Visit  If you have any questions or concerns for your doctor, please call our main line at [phone] and press option 4 to reach your doctor's medical assistant. If no one answers, please leave a voicemail as directed and we will return your call as soon as possible. Messages left after 4 pm will be answered the following business day.   You may also send [place] a message via MyChart. We typically respond to MyChart messages within 1-2 business days.  For prescription refills, please ask your pharmacy to contact our office. Our fax number is [phone].  If you have an urgent issue when the clinic is closed that cannot wait until the next business day, you can page your doctor at the number below.    Please note that while we do our best to be available for urgent issues outside of office hours, we are not available 24/7.   If you have an urgent issue and are unable to reach [place], you may choose to  seek medical care at your doctor's office, retail clinic, urgent care center, or emergency room.  If you have a medical emergency, please immediately call 911 or go to the emergency department.  Pager Numbers  - Dr. Gwen Pounds: [phone]  - Dr. Roseanne Reno: [phone]  - Dr. Katrinka Blazing: [phone]   In the event of inclement weather, please call our main line at [phone] for an update on the status of any delays or closures.  Dermatology Medication Tips: Please keep the boxes that topical medications come in in order to help keep track of the instructions about where and how to use these. Pharmacies typically print the  medication instructions only on the boxes and not directly on the medication tubes.   If your medication is too expensive, please contact our office at [phone] option 4 or send [place] a message through MyChart.   We are unable to tell what your co-pay for medications will be in advance as this is different depending on your insurance coverage. However, we may be able to find a substitute medication at lower cost or fill out paperwork to get insurance to cover a needed medication.   If a prior authorization is required to get your medication covered by your insurance company, please allow [place] 1-2 business days to complete this process.  Drug prices often vary depending on where the prescription is filled and some pharmacies may offer cheaper prices.  The website www.goodrx.com contains coupons for medications through different pharmacies. The prices here do not account for what the cost may be with help from insurance (it may be cheaper with your insurance), but the website can give you the price if you did not use any insurance.  - You can print the associated coupon and take it with your prescription to the pharmacy.  - You may also stop by our office during regular business hours and pick up a GoodRx coupon card.  - If you need your prescription sent electronically to a different pharmacy, notify our office through [facility] or by phone at [phone] option 4.

## 2023-02-06 NOTE — Progress Notes (Signed)
Follow-Up Visit   Subjective  Amanda Mann is a 89 y.o. female who presents for the following: Skin Cancer Screening and Full Body Skin Exam  The patient presents for Total-Body Skin Exam (TBSE) for skin cancer screening and mole check. The patient has spots, moles and lesions to be evaluated, some may be new or changing and the patient may have concern these could be cancer.  Hx of MM, dysplastic nevus. Patient had RSV vaccine about 2 weeks ago and 1 week later she has started itching. Unsure if it could be related.   Patient was given ketoconazole and Kerydin last year for tinea. Patient advises it really has not helped but she is not bothered by it.   The following portions of the chart were reviewed this encounter and updated as appropriate: medications, allergies, medical history  Review of Systems:  No other skin or systemic complaints except as noted in HPI or Assessment and Plan.  Objective  Well appearing patient in no apparent distress; mood and affect are within normal limits.  A full examination was performed including scalp, head, eyes, ears, nose, lips, neck, chest, axillae, abdomen, back, buttocks, bilateral upper extremities, bilateral lower extremities, hands, feet, fingers, toes, fingernails, and toenails. All findings within normal limits unless otherwise noted below.   Relevant physical exam findings are noted in the Assessment and Plan.    Assessment & Plan   SKIN CANCER SCREENING PERFORMED TODAY.  ACTINIC DAMAGE - Chronic condition, secondary to cumulative UV/sun exposure - diffuse scaly erythematous macules with underlying dyspigmentation - Recommend daily broad spectrum sunscreen SPF 30+ to sun-exposed areas, reapply every 2 hours as needed.  - Staying in the shade or wearing long sleeves, sun glasses (UVA+UVB protection) and wide brim hats (4-inch brim around the entire circumference of the hat) are also recommended for sun protection.  - Call for new  or changing lesions.  LENTIGINES, SEBORRHEIC KERATOSES, HEMANGIOMAS - Benign normal skin lesions - Benign-appearing - Call for any changes  MELANOCYTIC NEVI - Tan-brown and/or pink-flesh-colored symmetric macules and papules - Benign appearing on exam today - Observation - Call clinic for new or changing moles - Recommend daily use of broad spectrum spf 30+ sunscreen to sun-exposed areas.   History of Melanoma - 1990 at left upper arm - No evidence of recurrence today - No lymphadenopathy - Recommend regular full body skin exams - Recommend daily broad spectrum sunscreen SPF 30+ to sun-exposed areas, reapply every 2 hours as needed.  - Call if any new or changing lesions are noted between office visits   History of Dysplastic Nevi - left lateral elbow, excised 2019 - No evidence of recurrence today - Recommend regular full body skin exams - Recommend daily broad spectrum sunscreen SPF 30+ to sun-exposed areas, reapply every 2 hours as needed.  - Call if any new or changing lesions are noted between office visits  ROSACEA Exam Mid face erythema with telangiectasias +/- scattered inflammatory papules  Rosacea is a chronic progressive skin condition usually affecting the face of adults, causing redness and/or acne bumps. It is treatable but not curable. It sometimes affects the eyes (ocular rosacea) as well. It may respond to topical and/or systemic medication and can flare with stress, sun exposure, alcohol, exercise, topical steroids (including hydrocortisone/cortisone 10) and some foods.  Daily application of broad spectrum spf 30+ sunscreen to face is recommended to reduce flares.  Patient denies grittiness of the eyes  Treatment Plan Continue metronidazole 0.75% cream at bedtime  Tinea pedis and unguium Patient has been treated in the past But she declines further treatment today.  Is not currently using any medication.   Return in about 1 year (around 02/06/2024) for  TBSE, with Dr. Kirtland Bouchard, Hx MM, Hx Dysplastic Nevi, Rosacea.  Amanda Mann, RMA, am acting as scribe for Armida Sans, MD .   Documentation: I have reviewed the above documentation for accuracy and completeness, and I agree with the above.  Armida Sans, MD

## 2024-02-10 ENCOUNTER — Ambulatory Visit: Payer: Medicare PPO | Admitting: Dermatology

## 2024-02-10 DIAGNOSIS — Z79899 Other long term (current) drug therapy: Secondary | ICD-10-CM

## 2024-02-10 DIAGNOSIS — L821 Other seborrheic keratosis: Secondary | ICD-10-CM | POA: Diagnosis not present

## 2024-02-10 DIAGNOSIS — L84 Corns and callosities: Secondary | ICD-10-CM

## 2024-02-10 DIAGNOSIS — Z1283 Encounter for screening for malignant neoplasm of skin: Secondary | ICD-10-CM | POA: Diagnosis not present

## 2024-02-10 DIAGNOSIS — Z85820 Personal history of malignant melanoma of skin: Secondary | ICD-10-CM

## 2024-02-10 DIAGNOSIS — W908XXA Exposure to other nonionizing radiation, initial encounter: Secondary | ICD-10-CM | POA: Diagnosis not present

## 2024-02-10 DIAGNOSIS — L820 Inflamed seborrheic keratosis: Secondary | ICD-10-CM | POA: Diagnosis not present

## 2024-02-10 DIAGNOSIS — D1801 Hemangioma of skin and subcutaneous tissue: Secondary | ICD-10-CM

## 2024-02-10 DIAGNOSIS — Z7189 Other specified counseling: Secondary | ICD-10-CM

## 2024-02-10 DIAGNOSIS — D229 Melanocytic nevi, unspecified: Secondary | ICD-10-CM

## 2024-02-10 DIAGNOSIS — L578 Other skin changes due to chronic exposure to nonionizing radiation: Secondary | ICD-10-CM | POA: Diagnosis not present

## 2024-02-10 DIAGNOSIS — L719 Rosacea, unspecified: Secondary | ICD-10-CM

## 2024-02-10 DIAGNOSIS — L814 Other melanin hyperpigmentation: Secondary | ICD-10-CM

## 2024-02-10 DIAGNOSIS — Z86018 Personal history of other benign neoplasm: Secondary | ICD-10-CM

## 2024-02-10 MED ORDER — METRONIDAZOLE 0.75 % EX CREA: TOPICAL_CREAM | CUTANEOUS | 11 refills | Status: AC

## 2024-02-10 NOTE — Progress Notes (Unsigned)
 Follow-Up Visit   Subjective  Amanda Mann is a 89 y.o. female who presents for the following: Skin Cancer Screening and Full Body Skin Exam, hx of Melanoma Patient c/o a painful corn on her right foot for several months.   The patient presents for Total-Body Skin Exam (TBSE) for skin cancer screening and mole check. The patient has spots, moles and lesions to be evaluated, some may be new or changing and the patient may have concern these could be cancer.  The following portions of the chart were reviewed this encounter and updated as appropriate: medications, allergies, medical history  Review of Systems:  No other skin or systemic complaints except as noted in HPI or Assessment and Plan.  Objective  Well appearing patient in no apparent distress; mood and affect are within normal limits.  A full examination was performed including scalp, head, eyes, ears, nose, lips, neck, chest, axillae, abdomen, back, buttocks, bilateral upper extremities, bilateral lower extremities, hands, feet, fingers, toes, fingernails, and toenails. All findings within normal limits unless otherwise noted below.   Relevant physical exam findings are noted in the Assessment and Plan.  left forehead Stuck-on, waxy, tan-brown papules and plaques -- Discussed benign etiology and prognosis.   Assessment & Plan   SKIN CANCER SCREENING PERFORMED TODAY.  ACTINIC DAMAGE - Chronic condition, secondary to cumulative UV/sun exposure - diffuse scaly erythematous macules with underlying dyspigmentation - Recommend daily broad spectrum sunscreen SPF 30+ to sun-exposed areas, reapply every 2 hours as needed.  - Staying in the shade or wearing long sleeves, sun glasses (UVA+UVB protection) and wide brim hats (4-inch brim around the entire circumference of the hat) are also recommended for sun protection.  - Call for new or changing lesions.  LENTIGINES, SEBORRHEIC KERATOSES, HEMANGIOMAS - Benign normal skin  lesions - Benign-appearing - Call for any changes  MELANOCYTIC NEVI - Tan-brown and/or pink-flesh-colored symmetric macules and papules - Benign appearing on exam today - Observation - Call clinic for new or changing moles - Recommend daily use of broad spectrum spf 30+ sunscreen to sun-exposed areas.    History of Melanoma - 1990 at left upper arm - No evidence of recurrence today - No lymphadenopathy - Recommend regular full body skin exams - Recommend daily broad spectrum sunscreen SPF 30+ to sun-exposed areas, reapply every 2 hours as needed.  - Call if any new or changing lesions are noted between office visits   History of Dysplastic Nevi - left lateral elbow, excised 2019 - No evidence of recurrence today - Recommend regular full body skin exams - Recommend daily broad spectrum sunscreen SPF 30+ to sun-exposed areas, reapply every 2 hours as needed.  - Call if any new or changing lesions are noted between office visits   ROSACEA Exam Mid face erythema -mild Rosacea is a chronic progressive skin condition usually affecting the face of adults, causing redness and/or acne bumps. It is treatable but not curable. It sometimes affects the eyes (ocular rosacea) as well. It may respond to topical and/or systemic medication and can flare with stress, sun exposure, alcohol, exercise, topical steroids (including hydrocortisone/cortisone 10) and some foods.  Daily application of broad spectrum spf 30+ sunscreen to face is recommended to reduce flares.  Chronic condition with duration or expected duration over one year. Currently well-controlled. Patient denies grittiness of the eyes Treatment Plan Continue metronidazole  0.75% cream at bedtime  INFLAMED SEBORRHEIC KERATOSIS left forehead Symptomatic, irritating, patient would like treated.  Destruction of lesion - left  forehead Complexity: simple   Destruction method: cryotherapy   Informed consent: discussed and consent obtained    Timeout:  patient name, date of birth, surgical site, and procedure verified Lesion destroyed using liquid nitrogen: Yes   Region frozen until ice ball extended beyond lesion: Yes   Outcome: patient tolerated procedure well with no complications   Post-procedure details: wound care instructions given    CORN OR CALLUS right foot Patient would like a referral to Podiatrist  Related Procedures Ambulatory referral to Podiatry   Return in about 1 year (around 02/09/2025) for TBSE, hx of Melanoma .  IFay Kirks, CMA, am acting as scribe for Alm Rhyme, MD .   Documentation: I have reviewed the above documentation for accuracy and completeness, and I agree with the above.  Alm Rhyme, MD

## 2024-02-10 NOTE — Patient Instructions (Addendum)

## 2024-02-11 ENCOUNTER — Encounter: Payer: Self-pay | Admitting: Dermatology

## 2025-02-09 ENCOUNTER — Ambulatory Visit: Admitting: Dermatology
